# Patient Record
Sex: Female | Born: 1970 | Race: White | Hispanic: No | Smoking: Never smoker
Health system: Southern US, Community
[De-identification: ages and names within clinical notes are randomized; demographics above are authoritative.]

## PROBLEM LIST (undated history)

## (undated) DIAGNOSIS — E039 Hypothyroidism, unspecified: Secondary | ICD-10-CM

## (undated) DIAGNOSIS — F329 Major depressive disorder, single episode, unspecified: Secondary | ICD-10-CM

## (undated) DIAGNOSIS — F32A Depression, unspecified: Secondary | ICD-10-CM

## (undated) DIAGNOSIS — G43909 Migraine, unspecified, not intractable, without status migrainosus: Secondary | ICD-10-CM

## (undated) DIAGNOSIS — D649 Anemia, unspecified: Secondary | ICD-10-CM

## (undated) HISTORY — PX: TUBAL LIGATION: SHX77

## (undated) HISTORY — PX: CHOLECYSTECTOMY: SHX55

## (undated) HISTORY — PX: OTHER SURGICAL HISTORY: SHX169

## (undated) HISTORY — DX: Migraine, unspecified, not intractable, without status migrainosus: G43.909

## (undated) HISTORY — PX: ABDOMINAL HYSTERECTOMY: SHX81

## (undated) HISTORY — PX: GASTRIC BYPASS: SHX52

---

## 2018-11-03 ENCOUNTER — Encounter (HOSPITAL_COMMUNITY): Admission: EM | Disposition: A | Discharge status: Home Health | Payer: Self-pay | Source: Home / Self Care

## 2018-11-03 ENCOUNTER — Observation Stay (HOSPITAL_COMMUNITY): Payer: Self-pay | Admitting: Certified Registered"

## 2018-11-03 ENCOUNTER — Encounter (HOSPITAL_COMMUNITY): Payer: Self-pay | Admitting: Emergency Medicine

## 2018-11-03 ENCOUNTER — Inpatient Hospital Stay (HOSPITAL_COMMUNITY)
Admission: EM | Admit: 2018-11-03 | Discharge: 2018-11-07 | DRG: 578 | Disposition: A | Discharge status: Home Health | Payer: Self-pay | Attending: Surgery | Admitting: Surgery

## 2018-11-03 ENCOUNTER — Other Ambulatory Visit: Payer: Self-pay

## 2018-11-03 ENCOUNTER — Observation Stay (HOSPITAL_COMMUNITY): Payer: Self-pay

## 2018-11-03 DIAGNOSIS — S81812A Laceration without foreign body, left lower leg, initial encounter: Secondary | ICD-10-CM | POA: Diagnosis present

## 2018-11-03 DIAGNOSIS — W540XXA Bitten by dog, initial encounter: Secondary | ICD-10-CM

## 2018-11-03 DIAGNOSIS — Z79899 Other long term (current) drug therapy: Secondary | ICD-10-CM

## 2018-11-03 DIAGNOSIS — E890 Postprocedural hypothyroidism: Secondary | ICD-10-CM | POA: Diagnosis present

## 2018-11-03 DIAGNOSIS — F329 Major depressive disorder, single episode, unspecified: Secondary | ICD-10-CM | POA: Diagnosis present

## 2018-11-03 DIAGNOSIS — Z9884 Bariatric surgery status: Secondary | ICD-10-CM

## 2018-11-03 DIAGNOSIS — T148XXA Other injury of unspecified body region, initial encounter: Secondary | ICD-10-CM

## 2018-11-03 DIAGNOSIS — S81852A Open bite, left lower leg, initial encounter: Principal | ICD-10-CM | POA: Diagnosis present

## 2018-11-03 DIAGNOSIS — M25572 Pain in left ankle and joints of left foot: Secondary | ICD-10-CM

## 2018-11-03 HISTORY — DX: Anemia, unspecified: D64.9

## 2018-11-03 HISTORY — PX: I & D EXTREMITY: SHX5045

## 2018-11-03 LAB — TYPE AND SCREEN
ABO/RH(D): O POS
Antibody Screen: NEGATIVE

## 2018-11-03 LAB — COMPREHENSIVE METABOLIC PANEL
ALT: 44 U/L (ref 0–44)
ANION GAP: 12 (ref 5–15)
AST: 54 U/L — ABNORMAL HIGH (ref 15–41)
Albumin: 3.6 g/dL (ref 3.5–5.0)
Alkaline Phosphatase: 80 U/L (ref 38–126)
BUN: 10 mg/dL (ref 6–20)
CO2: 25 mmol/L (ref 22–32)
Calcium: 7.4 mg/dL — ABNORMAL LOW (ref 8.9–10.3)
Chloride: 103 mmol/L (ref 98–111)
Creatinine, Ser: 0.58 mg/dL (ref 0.44–1.00)
GFR calc Af Amer: >60 mL/min (ref >60)
GFR calc non Af Amer: >60 mL/min (ref >60)
Glucose, Bld: 109 mg/dL — ABNORMAL HIGH (ref 70–99)
Potassium: 3.9 mmol/L (ref 3.5–5.1)
Sodium: 140 mmol/L (ref 135–145)
Total Bilirubin: 0.9 mg/dL (ref 0.3–1.2)
Total Protein: 7.1 g/dL (ref 6.5–8.1)

## 2018-11-03 LAB — CBC WITH DIFFERENTIAL/PLATELET
Abs Immature Granulocytes: 0.02 10*3/uL (ref 0.00–0.07)
Basophils Absolute: 0 10*3/uL (ref 0.0–0.1)
Basophils Relative: 0 %
Eosinophils Absolute: 0.1 10*3/uL (ref 0.0–0.5)
Eosinophils Relative: 1 %
HCT: 44.4 % (ref 36.0–46.0)
Hemoglobin: 14.6 g/dL (ref 12.0–15.0)
Immature Granulocytes: 0 %
Lymphocytes Relative: 38 %
Lymphs Abs: 3.2 10*3/uL (ref 0.7–4.0)
MCH: 32.9 pg (ref 26.0–34.0)
MCHC: 32.9 g/dL (ref 30.0–36.0)
MCV: 100 fL (ref 80.0–100.0)
Monocytes Absolute: 0.5 10*3/uL (ref 0.1–1.0)
Monocytes Relative: 5 %
NEUTROS PCT: 56 %
Neutro Abs: 4.8 10*3/uL (ref 1.7–7.7)
Platelets: 176 10*3/uL (ref 150–400)
RBC: 4.44 MIL/uL (ref 3.87–5.11)
RDW: 12.8 % (ref 11.5–15.5)
WBC: 8.6 10*3/uL (ref 4.0–10.5)
nRBC: 0 % (ref 0.0–0.2)

## 2018-11-03 LAB — ABO/RH: ABO/RH(D): O POS

## 2018-11-03 LAB — I-STAT BETA HCG BLOOD, ED (MC, WL, AP ONLY): I-stat hCG, quantitative: 12.5 m[IU]/mL — ABNORMAL HIGH (ref <5)

## 2018-11-03 SURGERY — IRRIGATION AND DEBRIDEMENT EXTREMITY
Anesthesia: General | Laterality: Left

## 2018-11-03 MED ORDER — SODIUM CHLORIDE 0.9 % IV SOLN
3.0000 g | Freq: Four times a day (QID) | INTRAVENOUS | Status: AC
  Administered 2018-11-04 – 2018-11-06 (×12): 3 g via INTRAVENOUS
  Filled 2018-11-03 (×12): qty 3

## 2018-11-03 MED ORDER — OXYCODONE HCL 5 MG PO TABS: 5.0000 mg | ORAL_TABLET | ORAL | Status: DC | PRN

## 2018-11-03 MED ORDER — HYDROMORPHONE HCL 1 MG/ML IJ SOLN
0.2500 mg | INTRAMUSCULAR | Status: DC | PRN
  Administered 2018-11-03 (×3): 0.5 mg via INTRAVENOUS

## 2018-11-03 MED ORDER — DEXAMETHASONE SODIUM PHOSPHATE 10 MG/ML IJ SOLN
INTRAMUSCULAR | Status: AC
  Filled 2018-11-03: qty 1

## 2018-11-03 MED ORDER — MIDAZOLAM HCL 2 MG/2ML IJ SOLN
INTRAMUSCULAR | Status: AC
  Filled 2018-11-03: qty 2

## 2018-11-03 MED ORDER — ONDANSETRON HCL 4 MG/2ML IJ SOLN
INTRAMUSCULAR | Status: AC
  Filled 2018-11-03: qty 2

## 2018-11-03 MED ORDER — PANTOPRAZOLE SODIUM 40 MG PO TBEC
40.0000 mg | DELAYED_RELEASE_TABLET | Freq: Every day | ORAL | Status: DC
  Administered 2018-11-04 – 2018-11-07 (×4): 40 mg via ORAL
  Filled 2018-11-03 (×4): qty 1

## 2018-11-03 MED ORDER — FENTANYL CITRATE (PF) 250 MCG/5ML IJ SOLN
INTRAMUSCULAR | Status: DC | PRN
  Administered 2018-11-03: 100 ug via INTRAVENOUS

## 2018-11-03 MED ORDER — EPHEDRINE 5 MG/ML INJ
INTRAVENOUS | Status: AC
  Filled 2018-11-03: qty 10

## 2018-11-03 MED ORDER — RABIES IMMUNE GLOBULIN 150 UNIT/ML IM INJ
20.0000 [IU]/kg | INJECTION | Freq: Once | INTRAMUSCULAR | Status: DC
  Filled 2018-11-03: qty 13

## 2018-11-03 MED ORDER — ONDANSETRON HCL 4 MG/2ML IJ SOLN: 4.0000 mg | Freq: Once | INTRAMUSCULAR | Status: DC | PRN

## 2018-11-03 MED ORDER — OXYCODONE HCL 5 MG PO TABS
10.0000 mg | ORAL_TABLET | ORAL | Status: DC | PRN
  Administered 2018-11-04 (×5): 10 mg via ORAL
  Filled 2018-11-03 (×5): qty 2

## 2018-11-03 MED ORDER — SODIUM CHLORIDE 0.9 % IV SOLN
INTRAVENOUS | Status: DC
  Administered 2018-11-03 – 2018-11-06 (×5): via INTRAVENOUS

## 2018-11-03 MED ORDER — SUCCINYLCHOLINE 20MG/ML (10ML) SYRINGE FOR MEDFUSION PUMP - OPTIME
INTRAMUSCULAR | Status: DC | PRN
  Administered 2018-11-03: 140 mg via INTRAVENOUS

## 2018-11-03 MED ORDER — SUCCINYLCHOLINE CHLORIDE 200 MG/10ML IV SOSY
PREFILLED_SYRINGE | INTRAVENOUS | Status: AC
  Filled 2018-11-03: qty 10

## 2018-11-03 MED ORDER — PROPOFOL 10 MG/ML IV BOLUS
INTRAVENOUS | Status: AC
  Filled 2018-11-03: qty 20

## 2018-11-03 MED ORDER — PROPOFOL 10 MG/ML IV BOLUS
INTRAVENOUS | Status: DC | PRN
  Administered 2018-11-03: 100 mg via INTRAVENOUS

## 2018-11-03 MED ORDER — HYDRALAZINE HCL 20 MG/ML IJ SOLN: 10.0000 mg | INTRAMUSCULAR | Status: DC | PRN

## 2018-11-03 MED ORDER — EPHEDRINE SULFATE 50 MG/ML IJ SOLN
INTRAMUSCULAR | Status: DC | PRN
  Administered 2018-11-03 (×4): 10 mg via INTRAVENOUS

## 2018-11-03 MED ORDER — SODIUM CHLORIDE 0.9 % IV SOLN
3.0000 g | Freq: Once | INTRAVENOUS | Status: AC
  Administered 2018-11-03: 3 g via INTRAVENOUS
  Filled 2018-11-03: qty 3

## 2018-11-03 MED ORDER — PANTOPRAZOLE SODIUM 40 MG IV SOLR
40.0000 mg | Freq: Every day | INTRAVENOUS | Status: DC
  Filled 2018-11-03: qty 40

## 2018-11-03 MED ORDER — DEXAMETHASONE SODIUM PHOSPHATE 10 MG/ML IJ SOLN
INTRAMUSCULAR | Status: DC | PRN
  Administered 2018-11-03: 10 mg via INTRAVENOUS

## 2018-11-03 MED ORDER — LIDOCAINE HCL (CARDIAC) PF 100 MG/5ML IV SOSY
PREFILLED_SYRINGE | INTRAVENOUS | Status: DC | PRN
  Administered 2018-11-03: 100 mg via INTRATRACHEAL

## 2018-11-03 MED ORDER — LIDOCAINE 2% (20 MG/ML) 5 ML SYRINGE
INTRAMUSCULAR | Status: AC
  Filled 2018-11-03: qty 5

## 2018-11-03 MED ORDER — MIDAZOLAM HCL 2 MG/2ML IJ SOLN
INTRAMUSCULAR | Status: DC | PRN
  Administered 2018-11-03: 2 mg via INTRAVENOUS

## 2018-11-03 MED ORDER — 0.9 % SODIUM CHLORIDE (POUR BTL) OPTIME
TOPICAL | Status: DC | PRN
  Administered 2018-11-03: 1000 mL

## 2018-11-03 MED ORDER — SODIUM CHLORIDE 0.9 % IR SOLN
Status: DC | PRN
  Administered 2018-11-03: 3000 mL

## 2018-11-03 MED ORDER — HYDROMORPHONE HCL 1 MG/ML IJ SOLN
INTRAMUSCULAR | Status: AC
  Administered 2018-11-03: 0.5 mg via INTRAVENOUS
  Filled 2018-11-03: qty 1

## 2018-11-03 MED ORDER — ONDANSETRON HCL 4 MG/2ML IJ SOLN
4.0000 mg | Freq: Four times a day (QID) | INTRAMUSCULAR | Status: DC | PRN
  Administered 2018-11-04 – 2018-11-07 (×7): 4 mg via INTRAVENOUS
  Filled 2018-11-03 (×8): qty 2

## 2018-11-03 MED ORDER — ACETAMINOPHEN 325 MG PO TABS
650.0000 mg | ORAL_TABLET | ORAL | Status: DC | PRN
  Administered 2018-11-04: 650 mg via ORAL
  Filled 2018-11-03: qty 2

## 2018-11-03 MED ORDER — FENTANYL CITRATE (PF) 250 MCG/5ML IJ SOLN
INTRAMUSCULAR | Status: AC
  Filled 2018-11-03: qty 5

## 2018-11-03 MED ORDER — LACTATED RINGERS IV SOLN
INTRAVENOUS | Status: DC | PRN
  Administered 2018-11-03 (×2): via INTRAVENOUS

## 2018-11-03 MED ORDER — RABIES VACCINE, PCEC IM SUSR
1.0000 mL | Freq: Once | INTRAMUSCULAR | Status: DC
  Filled 2018-11-03: qty 1

## 2018-11-03 MED ORDER — HEPARIN SODIUM (PORCINE) 5000 UNIT/ML IJ SOLN
5000.0000 [IU] | Freq: Three times a day (TID) | INTRAMUSCULAR | Status: DC
  Administered 2018-11-03 – 2018-11-07 (×11): 5000 [IU] via SUBCUTANEOUS
  Filled 2018-11-03 (×11): qty 1

## 2018-11-03 MED ORDER — ONDANSETRON 4 MG PO TBDP: 4.0000 mg | ORAL_TABLET | Freq: Four times a day (QID) | ORAL | Status: DC | PRN

## 2018-11-03 MED ORDER — MEPERIDINE HCL 50 MG/ML IJ SOLN: 6.2500 mg | INTRAMUSCULAR | Status: DC | PRN

## 2018-11-03 MED ORDER — PROMETHAZINE HCL 25 MG/ML IJ SOLN
INTRAMUSCULAR | Status: AC
  Administered 2018-11-03: 12.5 mg
  Filled 2018-11-03: qty 1

## 2018-11-03 MED ORDER — HYDROMORPHONE HCL 1 MG/ML IJ SOLN
0.5000 mg | INTRAMUSCULAR | Status: DC | PRN
  Administered 2018-11-04 – 2018-11-06 (×12): 0.5 mg via INTRAVENOUS
  Filled 2018-11-03 (×12): qty 1

## 2018-11-03 MED ORDER — METOPROLOL TARTRATE 5 MG/5ML IV SOLN: 5.0000 mg | Freq: Four times a day (QID) | INTRAVENOUS | Status: DC | PRN

## 2018-11-03 MED ORDER — DOCUSATE SODIUM 100 MG PO CAPS
100.0000 mg | ORAL_CAPSULE | Freq: Two times a day (BID) | ORAL | Status: DC
  Administered 2018-11-03 – 2018-11-07 (×8): 100 mg via ORAL
  Filled 2018-11-03 (×8): qty 1

## 2018-11-03 MED ORDER — ONDANSETRON HCL 4 MG/2ML IJ SOLN
INTRAMUSCULAR | Status: DC | PRN
  Administered 2018-11-03: 4 mg via INTRAVENOUS

## 2018-11-03 SURGICAL SUPPLY — 35 items
BANDAGE ACE 4X5 VEL STRL LF (GAUZE/BANDAGES/DRESSINGS) ×3 IMPLANT
BLADE CLIPPER SURG (BLADE) IMPLANT
BNDG GAUZE ELAST 4 BULKY (GAUZE/BANDAGES/DRESSINGS) ×3 IMPLANT
CANISTER SUCT 3000ML PPV (MISCELLANEOUS) ×3 IMPLANT
COVER SURGICAL LIGHT HANDLE (MISCELLANEOUS) ×3 IMPLANT
COVER WAND RF STERILE (DRAPES) ×3 IMPLANT
DRAPE LAPAROSCOPIC ABDOMINAL (DRAPES) IMPLANT
DRAPE LAPAROTOMY 100X72 PEDS (DRAPES) IMPLANT
DRSG PAD ABDOMINAL 8X10 ST (GAUZE/BANDAGES/DRESSINGS) ×3 IMPLANT
ELECT CAUTERY BLADE 6.4 (BLADE) ×3 IMPLANT
ELECT REM PT RETURN 9FT ADLT (ELECTROSURGICAL) ×3
ELECTRODE REM PT RTRN 9FT ADLT (ELECTROSURGICAL) ×1 IMPLANT
GAUZE SPONGE 4X4 12PLY STRL (GAUZE/BANDAGES/DRESSINGS) ×3 IMPLANT
GLOVE BIO SURGEON STRL SZ 6 (GLOVE) ×6 IMPLANT
GLOVE BIO SURGEON STRL SZ 6.5 (GLOVE) ×2 IMPLANT
GLOVE BIO SURGEONS STRL SZ 6.5 (GLOVE) ×1
GLOVE INDICATOR 6.5 STRL GRN (GLOVE) ×6 IMPLANT
GOWN STRL REUS W/ TWL LRG LVL3 (GOWN DISPOSABLE) ×2 IMPLANT
GOWN STRL REUS W/TWL LRG LVL3 (GOWN DISPOSABLE) ×4
HANDPIECE INTERPULSE COAX TIP (DISPOSABLE) ×2
KIT BASIN OR (CUSTOM PROCEDURE TRAY) ×3 IMPLANT
KIT TURNOVER KIT B (KITS) ×3 IMPLANT
NS IRRIG 1000ML POUR BTL (IV SOLUTION) ×3 IMPLANT
PACK SURGICAL SETUP 50X90 (CUSTOM PROCEDURE TRAY) ×3 IMPLANT
PAD ARMBOARD 7.5X6 YLW CONV (MISCELLANEOUS) ×3 IMPLANT
PENCIL BUTTON HOLSTER BLD 10FT (ELECTRODE) ×3 IMPLANT
SET HNDPC FAN SPRY TIP SCT (DISPOSABLE) ×1 IMPLANT
SUT ETHILON 3 0 PS 1 (SUTURE) ×6 IMPLANT
SWAB COLLECTION DEVICE MRSA (MISCELLANEOUS) IMPLANT
SWAB CULTURE ESWAB REG 1ML (MISCELLANEOUS) IMPLANT
TOWEL OR 17X24 6PK STRL BLUE (TOWEL DISPOSABLE) ×3 IMPLANT
TOWEL OR 17X26 10 PK STRL BLUE (TOWEL DISPOSABLE) ×3 IMPLANT
TUBE CONNECTING 12'X1/4 (SUCTIONS) ×1
TUBE CONNECTING 12X1/4 (SUCTIONS) ×2 IMPLANT
YANKAUER SUCT BULB TIP NO VENT (SUCTIONS) ×3 IMPLANT

## 2018-11-03 NOTE — ED Notes (Signed)
Pt to OR at this time.

## 2018-11-03 NOTE — ED Notes (Signed)
Dr Connor at bedside 

## 2018-11-03 NOTE — H&P (Signed)
Surgical H&P  CC: dog bite  HPI: this is a 47 year old woman who sustained a pedicle tacked today when she is trying to keep the dog away from her niece. She sustained complex lacerations to the left lower leg. She was brought in by EMS approximately 5:30 PM today. Complains of burning and stinging pain to the sites.   She has a history of hypothyroid and says she takes 250 g of Synthroid daily. Denies any history of heart disease, lung disease, or diabetes.  Allergies not on file  Past Medical History:  Diagnosis Date  . Anemia     Past Surgical History:  Procedure Laterality Date  . ABDOMINAL HYSTERECTOMY    . CHOLECYSTECTOMY    . GASTRIC BYPASS    . TUBAL LIGATION      No family history on file.  Social History   Socioeconomic History  . Marital status: Not on file    Spouse name: Not on file  . Number of children: Not on file  . Years of education: Not on file  . Highest education level: Not on file  Occupational History  . Not on file  Social Needs  . Financial resource strain: Not on file  . Food insecurity:    Worry: Not on file    Inability: Not on file  . Transportation needs:    Medical: Not on file    Non-medical: Not on file  Tobacco Use  . Smoking status: Never Smoker  . Smokeless tobacco: Never Used  Substance and Sexual Activity  . Alcohol use: Yes    Comment: Wine  . Drug use: Never  . Sexual activity: Not on file  Lifestyle  . Physical activity:    Days per week: Not on file    Minutes per session: Not on file  . Stress: Not on file  Relationships  . Social connections:    Talks on phone: Not on file    Gets together: Not on file    Attends religious service: Not on file    Active member of club or organization: Not on file    Attends meetings of clubs or organizations: Not on file    Relationship status: Not on file  Other Topics Concern  . Not on file  Social History Narrative  . Not on file    No current facility-administered  medications on file prior to encounter.    No current outpatient medications on file prior to encounter.    Review of Systems: a complete, 10pt review of systems was completed with pertinent positives and negatives as documented in the HPI  Physical Exam: Vitals:   11/03/18 1745 11/03/18 1800  BP: 106/69 101/72  Pulse: 72 78  Resp:    Temp:    SpO2: 95% 97%   Gen: A&Ox3, she is tearful and appropriately emotionally distressed Head: normocephalic, atraumatic Eyes: extraocular motions intact, anicteric.  Neck: supple without mass or thyromegaly Chest: unlabored respirations, symmetrical air entry, clear bilaterally   Cardiovascular: RRR with palpable distal pulses, no pedal edema Abdomen: obese, soft, nondistended, nontender. No mass or organomegaly.  Extremities: warm, without edema; there is a approximately 12 x 15 cm complex laceration on the left medial posterior calf with exposed muscle as well as stellate laceration on the left anterior medial lower leg, no active bleeding. Neuro: grossly intact Psych: appropriate mood and affect, normal insight  Skin: warm and dry, C Nguyen description above   No flowsheet data found.  No flowsheet data found.  No results found for: INR, PROTIME  Imaging: No results found.    A/P: 47 year old woman sustained complex avulsion laceration to the left lower extremity following dog bite. The dog is known to be vaccinated against rabies. Apparently the dog as attacked child about a month ago as well. We discussed that the larger laceration is not going to close and so likely will debride the free paddle of skin and place a damp to dry dressing, possible wound VAC and future evaluation by plastic surgery for skin graft coverage. The more anterior laceration medial to close loosely. We discussed high risk of infection in these wounds. Discussed risks of bleeding, infection, pain, scarring, risks of general anesthesia, she understands and agrees  to proceed.   Phylliss Blakeshelsea Connor, MD [facility], [place] Pager [phone]

## 2018-11-03 NOTE — Anesthesia Procedure Notes (Signed)
Procedure Name: Intubation Date/Time: 11/03/2018 7:13 PM Performed by: Claris Che, CRNA Pre-anesthesia Checklist: Patient identified, Emergency Drugs available, Suction available, Patient being monitored and Timeout performed Patient Re-evaluated:Patient Re-evaluated prior to induction Oxygen Delivery Method: Circle system utilized Preoxygenation: Pre-oxygenation with 100% oxygen Induction Type: IV induction, Rapid sequence and Cricoid Pressure applied Laryngoscope Size: Mac and 3 Grade View: Grade I Tube type: Oral Tube size: 7.5 mm Number of attempts: 1 Airway Equipment and Method: Stylet Placement Confirmation: ETT inserted through vocal cords under direct vision,  positive ETCO2 and breath sounds checked- equal and bilateral Secured at: 23 cm Tube secured with: Tape Dental Injury: Teeth and Oropharynx as per pre-operative assessment

## 2018-11-03 NOTE — Anesthesia Preprocedure Evaluation (Signed)
Anesthesia Evaluation  Patient identified by MRN, date of birth, ID band Patient awake    Reviewed: Allergy & Precautions, NPO status , Patient's Chart, lab work & pertinent test results  Airway Mallampati: I  TM Distance: >3 FB Neck ROM: Full    Dental   Pulmonary    Pulmonary exam normal        Cardiovascular Normal cardiovascular exam     Neuro/Psych    GI/Hepatic   Endo/Other    Renal/GU      Musculoskeletal   Abdominal   Peds  Hematology   Anesthesia Other Findings   Reproductive/Obstetrics                             Anesthesia Physical Anesthesia Plan  ASA: III  Anesthesia Plan: General   Post-op Pain Management:    Induction: Intravenous  PONV Risk Score and Plan: 3 and Ondansetron and Midazolam  Airway Management Planned: Oral ETT  Additional Equipment:   Intra-op Plan:   Post-operative Plan: Extubation in OR  Informed Consent: I have reviewed the patients History and Physical, chart, labs and discussed the procedure including the risks, benefits and alternatives for the proposed anesthesia with the patient or authorized representative who has indicated his/her understanding and acceptance.     Plan Discussed with: CRNA and Surgeon  Anesthesia Plan Comments:         Anesthesia Quick Evaluation

## 2018-11-03 NOTE — ED Triage Notes (Signed)
Pt to ED via [facility] after reported being attacked by a pit bull dog.  Pt has large lacerations to left lower leg   Bleeding controlled at this time

## 2018-11-03 NOTE — Op Note (Signed)
Operative Note  Ann MarshallKimberly Randall  [id number]  [id number]  11/03/2018   Surgeon: Leeroy Bockhelsea A ConnorMD  Assistant: none  Procedure performed: debridement of skin, subcutaneous tissue, fascia and muscle 120cm with partial closure of complex degloving avulsion laceration of the left lower extremity  Preop diagnosis: complex wound of left lower extremity following dog bite Post-op diagnosis/intraop findings: avulsion laceration with partial degloving injury,  wound extends to the muscle surface  Specimens: none Retained items: Kerlix packing will be changed at bedside tomorrow EBL: minimalcc Complications: none  Description of procedure: After obtaining informed consent the patient was taken to the operating room and placed supine on operating room table wheregeneral anesthesia was initiated, preoperative antibiotics were administered, SCDs applied, and a formal timeout was performed. The left lower cavity was prepped and draped in the usual sterile fashion. The wounds were inspected, denuded and devitalized skin was excised using the cautery. There is a proximal a 10 x 12 cm avulsion laceration on the posterior medial calf with 2 adjacent small puncture lacerations with small devitalized skin bridges. The skin bridges are excised creating a relatively clean edged circular wound. This wound extends down to the muscle belly of gastrocnemius. There is no active bleeding. The subcutaneous tissue is degloved from the underlying fascia anteromedially connecting via an approximately 3 cm skin bridge this larger open wound to the more stellate laceration on the anteromedial lower leg. Both wounds were ensured hemostatic using the cautery. The wounds were irrigated with the pulse lavage, using 3 L of sterile saline. The anterior laceration was then closed loosely with simpleinterrupted 3-0 nylon's. The larger more posterior wound was packed with saline moistened Kerlix, one tail of the Kerlix gently placed  under the skin bridge to wick the closed wound anteriorly. Dry gauze and AVD pads were then applied and the leg is wrapped with an Ace wrap. The patient was then awakened, extubated and taken to PACU in stable condition.   All counts were correct at the completion of the case.

## 2018-11-03 NOTE — ED Notes (Signed)
Animal control at bedside

## 2018-11-03 NOTE — ED Provider Notes (Signed)
[name]   CSN: [id number] Arrival date & time: 11/03/18  1724     History   Chief Complaint Chief Complaint  Patient presents with  . Animal Bite    HPI Ann Randall is a 47 y.o. female.  The history is provided by the patient and the EMS personnel. No language interpreter was used.  Animal Bite  Contact animal:  Dog Location:  Leg Leg injury location:  L lower leg Incident location:  Another residence Provoked: provoked   Animal's rabies vaccination status:  Up to date Animal in possession: yes   Tetanus status:  Up to date Relieved by:  Nothing Worsened by:  Nothing Ineffective treatments:  None tried Associated symptoms: numbness (mild)   Associated symptoms: no fever, no rash and no swelling     Past Medical History:  Diagnosis Date  . Anemia     There are no active problems to display for this patient.   Past Surgical History:  Procedure Laterality Date  . ABDOMINAL HYSTERECTOMY    . CHOLECYSTECTOMY    . GASTRIC BYPASS    . TUBAL LIGATION       OB History   None      Home Medications    Prior to Admission medications   Not on File    Family History No family history on file.  Social History Social History   Tobacco Use  . Smoking status: Never Smoker  . Smokeless tobacco: Never Used  Substance Use Topics  . Alcohol use: Yes    Comment: Wine  . Drug use: Never     Allergies   Patient has no allergy information on record.   Review of Systems Review of Systems  Constitutional: Negative for chills and fever.  HENT: Negative for ear pain and sore throat.   Eyes: Negative for pain and visual disturbance.  Respiratory: Negative for cough and shortness of breath.   Cardiovascular: Negative for chest pain and palpitations.  Gastrointestinal: Negative for abdominal pain and vomiting.  Genitourinary: Negative for dysuria and hematuria.  Musculoskeletal: Negative for  arthralgias and back pain.  Skin: Positive for wound. Negative for color change and rash.  Neurological: Positive for numbness (mild). Negative for seizures and syncope.  All other systems reviewed and are negative.    Physical Exam Updated Vital Signs BP 101/72   Pulse 78   Temp 98.2 F (36.8 C) (Oral)   Resp 20   Ht 5\' 4"  (1.626 m)   Wt 98.9 kg   LMP  (Exact Date)   SpO2 97%   BMI 37.42 kg/m   Physical Exam  Constitutional: She appears well-developed and well-nourished. No distress.  HENT:  Head: Normocephalic and atraumatic.  Eyes: Conjunctivae are normal.  Neck: Neck supple.  Cardiovascular: Normal rate and regular rhythm.  No murmur heard. Pulmonary/Chest: Effort normal and breath sounds normal. No respiratory distress.  Abdominal: Soft. There is no tenderness.  Musculoskeletal: She exhibits no edema.       Left foot: There is normal capillary refill.  2+ DP/PT palpable on L leg.  Mild numbness on L sole of foot  Large laceration on LLE  Neurological: She is alert.  Skin: Skin is warm and dry.  Psychiatric: She has a normal mood and affect.  Nursing note and vitals reviewed.        ED Treatments / Results  Labs (all labs ordered are listed, but only abnormal results are displayed) Labs Reviewed  CBC WITH DIFFERENTIAL/PLATELET  COMPREHENSIVE METABOLIC PANEL  I-STAT BETA HCG BLOOD, ED ([facility], [facility], [facility] ONLY)  TYPE AND SCREEN    EKG None  Radiology No results found.  Procedures Procedures (including critical care time)  Medications Ordered in ED Medications  Ampicillin-Sulbactam (UNASYN) 3 g in sodium chloride 0.9 % 100 mL IVPB (has no administration in time range)     Initial Impression / Assessment and Plan / ED Course  I have reviewed the triage vital signs and the nursing notes.  Pertinent labs & imaging results that were available during my care of the patient were reviewed by me and considered in my medical decision making (see chart for  details).     Patient is a 47 y.o. female with no sig PMHx presenting for large dog bite on LLE. No neurovascular compromise noted. Dog and patient up to date on vaccinations. Patient given IV unasyn and IV pain medication. Labs unremarkable - Hgb stable at 14.6, platelets normal. XR shows large laceration with no bone involvement.  Trauma surgery was consulted and came to see patient at the bedside. Patient will be taken to the OR for wash out.   Final Clinical Impressions(s) / ED Diagnoses   Final diagnoses:  Animal bite  Dog bite of left lower leg, initial encounter    ED Discharge Orders    None       Joaquin Courts, MD 11/03/18 Herminio Commons    Eber Hong, MD 11/05/18 [phone]

## 2018-11-03 NOTE — Transfer of Care (Signed)
Immediate Anesthesia Transfer of Care Note  Patient: Ann Randall  Procedure(s) Performed: DEBRIDEMENT OF LEFT LOWER EXTREMITY WOUND (Left )  Patient Location: PACU  Anesthesia Type:General  Level of Consciousness: awake, oriented, drowsy and patient cooperative  Airway & Oxygen Therapy: Patient Spontanous Breathing and Patient connected to nasal cannula oxygen  Post-op Assessment: Report given to RN, Post -op Vital signs reviewed and stable and Patient moving all extremities X 4  Post vital signs: Reviewed and stable  Last Vitals:  Vitals Value Taken Time  BP 122/83 11/03/2018  7:58 PM  Temp    Pulse 96 11/03/2018  8:04 PM  Resp 17 11/03/2018  8:04 PM  SpO2 92 % 11/03/2018  8:04 PM  Vitals shown include unvalidated device data.  Last Pain:  Vitals:   11/03/18 1741  TempSrc:   PainSc: 10-Worst pain ever         Complications: No apparent anesthesia complications

## 2018-11-03 NOTE — Anesthesia Postprocedure Evaluation (Signed)
Anesthesia Post Note  Patient: Chanetta MarshallKimberly Rosten  Procedure(s) Performed: DEBRIDEMENT OF LEFT LOWER EXTREMITY WOUND (Left )     Patient location during evaluation: PACU Anesthesia Type: General Level of consciousness: awake and alert Pain management: pain level controlled Vital Signs Assessment: post-procedure vital signs reviewed and stable Respiratory status: spontaneous breathing, nonlabored ventilation, respiratory function stable and patient connected to nasal cannula oxygen Cardiovascular status: blood pressure returned to baseline and stable Postop Assessment: no apparent nausea or vomiting Anesthetic complications: no    Last Vitals:  Vitals:   11/03/18 2043 11/03/18 2045  BP: 116/77   Pulse: 95   Resp: 18   Temp:  (P) 36.6 C  SpO2: 97%     Last Pain:  Vitals:   11/03/18 1741  TempSrc:   PainSc: 10-Worst pain ever                 Rexanna Louthan DAVID

## 2018-11-03 NOTE — ED Provider Notes (Signed)
I saw and evaluated the patient, reviewed the resident's note and I agree with the findings and plan.  Pertinent History: Patient is a unfortunate 47 year old female who is very pleasant but unfortunately was bitten on her left lower extremity by a dog, this is the neighbors dog, on a chain, unsure about vaccination status, reports that this occurred just prior to arrival.  Pain is severe persistent and worse with palpation, bleeding controlled  Pertinent Exam findings: On exam the patient has several large wounds which are open to the left lower extremity medially and anteriorly below the knee.  Pulses intact of the foot, pictures attached  The case was discussed with the trauma surgeon, she will see the patient in consultation for potential operating room cleanout.  I was personally present and directly supervised the following procedures:  Trauma Rescucitation  We will consult with trauma surgery, make sure tetanus is updated, adequate pain control, Abx.        I personally interpreted the EKG as well as the resident and agree with the interpretation on the resident's chart.  Final diagnoses:  Animal bite  Dog bite of left lower leg, initial encounter      Eber HongMiller, Lamanda Rudder, MD 11/05/18 [phone]

## 2018-11-04 ENCOUNTER — Other Ambulatory Visit: Payer: Self-pay

## 2018-11-04 ENCOUNTER — Encounter (HOSPITAL_COMMUNITY): Payer: Self-pay | Admitting: Surgery

## 2018-11-04 DIAGNOSIS — S81852A Open bite, left lower leg, initial encounter: Secondary | ICD-10-CM

## 2018-11-04 DIAGNOSIS — W540XXA Bitten by dog, initial encounter: Secondary | ICD-10-CM

## 2018-11-04 LAB — COMPREHENSIVE METABOLIC PANEL
ALT: 38 U/L (ref 0–44)
AST: 41 U/L (ref 15–41)
Albumin: 3.4 g/dL — ABNORMAL LOW (ref 3.5–5.0)
Alkaline Phosphatase: 74 U/L (ref 38–126)
Anion gap: 9 (ref 5–15)
BUN: 11 mg/dL (ref 6–20)
CHLORIDE: 107 mmol/L (ref 98–111)
CO2: 27 mmol/L (ref 22–32)
CREATININE: 0.71 mg/dL (ref 0.44–1.00)
Calcium: 7.4 mg/dL — ABNORMAL LOW (ref 8.9–10.3)
GFR calc Af Amer: >60 mL/min (ref >60)
GFR calc non Af Amer: >60 mL/min (ref >60)
Glucose, Bld: 173 mg/dL — ABNORMAL HIGH (ref 70–99)
Potassium: 4.4 mmol/L (ref 3.5–5.1)
Sodium: 143 mmol/L (ref 135–145)
Total Bilirubin: 0.6 mg/dL (ref 0.3–1.2)
Total Protein: 6.7 g/dL (ref 6.5–8.1)

## 2018-11-04 LAB — CBC
HCT: 45.1 % (ref 36.0–46.0)
Hemoglobin: 14.1 g/dL (ref 12.0–15.0)
MCH: 32.1 pg (ref 26.0–34.0)
MCHC: 31.3 g/dL (ref 30.0–36.0)
MCV: 102.7 fL — ABNORMAL HIGH (ref 80.0–100.0)
Platelets: 189 10*3/uL (ref 150–400)
RBC: 4.39 MIL/uL (ref 3.87–5.11)
RDW: 12.7 % (ref 11.5–15.5)
WBC: 6.9 10*3/uL (ref 4.0–10.5)
nRBC: 0 % (ref 0.0–0.2)

## 2018-11-04 LAB — HIV ANTIBODY (ROUTINE TESTING W REFLEX): HIV Screen 4th Generation wRfx: NONREACTIVE

## 2018-11-04 MED ORDER — MAGNESIUM OXIDE 400 (241.3 MG) MG PO TABS
400.0000 mg | ORAL_TABLET | Freq: Every day | ORAL | Status: DC
  Administered 2018-11-04 – 2018-11-07 (×4): 400 mg via ORAL
  Filled 2018-11-04 (×4): qty 1

## 2018-11-04 MED ORDER — ACETAMINOPHEN 500 MG PO TABS: 1000.0000 mg | ORAL_TABLET | Freq: Three times a day (TID) | ORAL | Status: DC

## 2018-11-04 MED ORDER — SERTRALINE HCL 100 MG PO TABS
100.0000 mg | ORAL_TABLET | Freq: Every day | ORAL | Status: DC
  Administered 2018-11-04 – 2018-11-07 (×4): 100 mg via ORAL
  Filled 2018-11-04 (×4): qty 1

## 2018-11-04 MED ORDER — LEVOTHYROXINE SODIUM 75 MCG PO TABS
375.0000 ug | ORAL_TABLET | Freq: Every day | ORAL | Status: DC
  Administered 2018-11-04: 375 ug via ORAL
  Filled 2018-11-04: qty 3

## 2018-11-04 MED ORDER — METHOCARBAMOL 750 MG PO TABS
750.0000 mg | ORAL_TABLET | Freq: Three times a day (TID) | ORAL | Status: DC
  Administered 2018-11-04 – 2018-11-06 (×8): 750 mg via ORAL
  Filled 2018-11-04 (×8): qty 1

## 2018-11-04 MED ORDER — ACETAMINOPHEN 500 MG PO TABS
1000.0000 mg | ORAL_TABLET | Freq: Three times a day (TID) | ORAL | Status: DC
  Administered 2018-11-04 – 2018-11-07 (×10): 1000 mg via ORAL
  Filled 2018-11-04 (×10): qty 2

## 2018-11-04 MED ORDER — CHLORHEXIDINE GLUCONATE CLOTH 2 % EX PADS
6.0000 | MEDICATED_PAD | Freq: Once | CUTANEOUS | Status: AC
  Administered 2018-11-04: 6 via TOPICAL

## 2018-11-04 MED ORDER — CEFAZOLIN SODIUM-DEXTROSE 2-4 GM/100ML-% IV SOLN
2.0000 g | INTRAVENOUS | Status: DC
  Filled 2018-11-04 (×2): qty 100

## 2018-11-04 MED ORDER — CALCIUM CARBONATE-VITAMIN D 500-200 MG-UNIT PO TABS
4.0000 | ORAL_TABLET | Freq: Two times a day (BID) | ORAL | Status: DC
  Administered 2018-11-04 – 2018-11-07 (×6): 4 via ORAL
  Filled 2018-11-04 (×6): qty 4

## 2018-11-04 MED ORDER — ADULT MULTIVITAMIN W/MINERALS CH
1.0000 | ORAL_TABLET | Freq: Every day | ORAL | Status: DC
  Administered 2018-11-04 – 2018-11-05 (×2): 1 via ORAL
  Filled 2018-11-04 (×2): qty 1

## 2018-11-04 MED ORDER — GABAPENTIN 300 MG PO CAPS
300.0000 mg | ORAL_CAPSULE | Freq: Three times a day (TID) | ORAL | Status: DC
  Administered 2018-11-04 – 2018-11-07 (×10): 300 mg via ORAL
  Filled 2018-11-04 (×10): qty 1

## 2018-11-04 MED ORDER — METHOCARBAMOL 750 MG PO TABS: 750.0000 mg | ORAL_TABLET | Freq: Three times a day (TID) | ORAL | Status: DC

## 2018-11-04 MED ORDER — BOOST / RESOURCE BREEZE PO LIQD CUSTOM
1.0000 | Freq: Two times a day (BID) | ORAL | Status: DC
  Administered 2018-11-04 – 2018-11-07 (×4): 1 via ORAL

## 2018-11-04 MED ORDER — VITAMIN C 500 MG PO TABS
500.0000 mg | ORAL_TABLET | Freq: Two times a day (BID) | ORAL | Status: DC
  Administered 2018-11-04 – 2018-11-05 (×3): 500 mg via ORAL
  Filled 2018-11-04 (×3): qty 1

## 2018-11-04 MED ORDER — CHLORHEXIDINE GLUCONATE CLOTH 2 % EX PADS
6.0000 | MEDICATED_PAD | Freq: Once | CUTANEOUS | Status: AC
  Administered 2018-11-05: 6 via TOPICAL

## 2018-11-04 MED ORDER — ZINC SULFATE 220 (50 ZN) MG PO CAPS
220.0000 mg | ORAL_CAPSULE | Freq: Every day | ORAL | Status: DC
  Administered 2018-11-04 – 2018-11-05 (×2): 220 mg via ORAL
  Filled 2018-11-04 (×2): qty 1

## 2018-11-04 NOTE — Progress Notes (Signed)
Pt arrived from PACU A&Ox4; able to explain situation. No distress noted. No c/o pain.

## 2018-11-04 NOTE — Progress Notes (Signed)
[facility] Progress Note  1 Day Post-Op  Subjective: CC-  Patient states that she continues to have a lot of pain in the LLE. Denies n/t. She has ambulated to the bathroom without issues.  Denies any abdominal pain, nausea, vomiting. Tolerating clear liquids.  [place], currently living with her sister. Not currently working.  Objective: Vital signs in last 24 hours: Temp:  [97.2 F (36.2 C)-98.2 F (36.8 C)] 98.1 F (36.7 C) (12/09 0628) Pulse Rate:  [66-101] 99 (12/09 0628) Resp:  [15-22] 19 (12/09 0628) BP: (95-122)/(67-83) 108/70 (12/09 0628) SpO2:  [94 %-99 %] 99 % (12/09 0628) Weight:  [98.9 kg] 98.9 kg (12/08 1700) Last BM Date: 11/03/18  Intake/Output from previous day: 12/08 0701 - 12/09 0700 In: 2167 [P.O.:270; I.V.:1645.6; IV Piggyback:51.4] Out: 2 [Blood:2] Intake/Output this shift: No intake/output data recorded.  PE: Gen:  Alert, NAD, pleasant HEENT: EOM's intact, pupils equal and round Card:  RRR, 2+ DP pulses bilaterally Pulm:  CTAB, no W/R/R, effort normal Abd: Soft, NT/ND, +BS, no HSM Psych: A&Ox3  Skin: warm and dry LLE: some dried blood noted to ABDs on LLE, ACE wrap clean. Foot WWP, no gross sensory or motor deficits  Lab Results:  Recent Labs    11/03/18 1755 11/04/18 0212  WBC 8.6 6.9  HGB 14.6 14.1  HCT 44.4 45.1  PLT 176 189   BMET Recent Labs    11/03/18 1755 11/04/18 0212  NA 140 143  K 3.9 4.4  CL 103 107  CO2 25 27  GLUCOSE 109* 173*  BUN 10 11  CREATININE 0.58 0.71  CALCIUM 7.4* 7.4*   PT/INR No results for input(s): LABPROT, INR in the last 72 hours. CMP     Component Value Date/Time   NA 143 11/04/2018 0212   K 4.4 11/04/2018 0212   CL 107 11/04/2018 0212   CO2 27 11/04/2018 0212   GLUCOSE 173 (H) 11/04/2018 0212   BUN 11 11/04/2018 0212   CREATININE 0.71 11/04/2018 0212   CALCIUM 7.4 (L) 11/04/2018 0212   PROT 6.7 11/04/2018 0212   ALBUMIN 3.4  (L) 11/04/2018 0212   AST 41 11/04/2018 0212   ALT 38 11/04/2018 0212   ALKPHOS 74 11/04/2018 0212   BILITOT 0.6 11/04/2018 0212   GFRNONAA >60 11/04/2018 0212   GFRAA >60 11/04/2018 0212   Lipase  No results found for: LIPASE     Studies/Results: Dg Tibia/fibula Left  Result Date: 11/03/2018 CLINICAL DATA:  Dog bite, laceration EXAM: LEFT TIBIA AND FIBULA - 2 VIEW COMPARISON:  None. FINDINGS: No fracture or dislocation is seen. The joint spaces are preserved. Large soft tissue laceration along the medial aspect of the mid tibia/calf. No radiopaque foreign body is seen. IMPRESSION: Large soft tissue laceration along the medial aspect of the mid tibia/calf. No fracture, dislocation, or radiopaque foreign body is seen. Electronically Signed   By: Charline BillsSriyesh  Krishnan M.D.   On: 11/03/2018 19:06    Anti-infectives: Anti-infectives (From admission, onward)   Start     Dose/Rate Route Frequency Ordered Stop   11/04/18 0230  Ampicillin-Sulbactam (UNASYN) 3 g in sodium chloride 0.9 % 100 mL IVPB    Note to Pharmacy:  Adjust dose as indicated, coverage for dog bite   3 g 200 mL/hr over 30 Minutes Intravenous Every 6 hours 11/03/18 1816     11/03/18 1730  Ampicillin-Sulbactam (UNASYN) 3 g in sodium chloride 0.9 % 100 mL IVPB  3 g 200 mL/hr over 30 Minutes Intravenous  Once 11/03/18 1729 11/03/18 1901       Assessment/Plan Dog bite Complex wound of left lower extremity - s/p debridement with partial closure 12/8 Dr. Fredricka Bonine. Dr. Ulice Bold to see, may take back to the OR Hypothyroidism - home synthroid Depression - home zoloft H/o gastric bypass Elevated Hcg - per patient this is chronic  ID - unasyn 12/8>> FEN - IVF, reg diet VTE - SCDs, sq heparin Foley - none  Plan: Add scheduled tylenol and robaxin for better pain control. PT/OT. Spoke with Dr. Ulice Bold, likely will take patient back to the OR tomorrow. Labs in AM.   LOS: 0 days    Franne Forts , [facility] 11/04/2018, 8:21 AM Pager: [phone] Mon 7:00 am -11:30 AM Tues-Fri 7:00 am-4:30 pm Sat-Sun 7:00 am-11:30 am

## 2018-11-04 NOTE — Evaluation (Signed)
Occupational Therapy Evaluation Patient Details Name: Ann Randall MRN: [id number] DOB: Jan 12, 1971 Today's Date: 11/04/2018    History of Present Illness Pt presents with dog bite to L lower leg. PMH: anemia, gastric bypass, cholecystectomy   Clinical Impression   This 47 y/o female presents with the above. Limited eval this session due to increased LLE pain levels. Provided education regarding LLE elevation and edema management and initiated education regarding compensatory strategies for performing LB ADLs. Pt currently requiring setup assist for seated UB ADL, mod-maxA for LB ADL; unable to tolerate OOB activity this session due to pain but noted able to perform functional mobility short distance with minA and RW with PT earlier today. Pt reports she will have 24hr supervision/assist available from family at time of discharge home. She will benefit from continued OT services while in acute setting to maximize her safety and independence with ADLs and mobility. Will follow.     Follow Up Recommendations  No OT follow up;Supervision/Assistance - 24 hour    Equipment Recommendations  None recommended by OT           Precautions / Restrictions Precautions Precautions: None Restrictions Weight Bearing Restrictions: No      Mobility Bed Mobility               General bed mobility comments: pt with increased pain with any LLE movement this session; pt able to raise LLE for repositioning/elevation and use of UEs to boost self towards [facility]  Transfers Overall transfer level: Needs assistance Equipment used: Rolling walker (2 wheeled) Transfers: Sit to/from Stand Sit to Stand: Min guard         General transfer comment: pt stood with great effort and increased time with vc's for hand placement on chair, close guarding needed for safety but no physical assist given, vc's for fwd wt shift    Balance Overall balance assessment: Mild deficits observed, not formally tested                                          ADL either performed or assessed with clinical judgement   ADL Overall ADL's : Needs assistance/impaired Eating/Feeding: Modified independent;Sitting   Grooming: Set up;Sitting   Upper Body Bathing: Min guard;Sitting   Lower Body Bathing: Moderate assistance;Sitting/lateral leans   Upper Body Dressing : Set up;Sitting   Lower Body Dressing: Moderate assistance;Maximal assistance;Sitting/lateral leans                 General ADL Comments: initiated education regarding compensatory strategies for LB ADL; pt limited this session due to increased pain; further education on LLE elevation and edema management     Vision         Perception     Praxis      Pertinent Vitals/Pain Pain Assessment: Faces Faces Pain Scale: Hurts whole lot Pain Location: LLE Pain Descriptors / Indicators: Aching;Burning Pain Intervention(s): Limited activity within patient's tolerance;Monitored during session;Repositioned     Hand Dominance     Extremity/Trunk Assessment Upper Extremity Assessment Upper Extremity Assessment: Overall WFL for tasks assessed   Lower Extremity Assessment Lower Extremity Assessment: Defer to PT evaluation RLE Deficits / Details: pt has some soreness posterior R calf as well as R hip, she fell on this side when the dog bit her RLE Sensation: WNL RLE Coordination: WNL LLE Deficits / Details: hip flex 3/5, knee ext 3/5, knee  flex 3/5, all mvmt painful. Swelling noted lateral knee and bruising central patellar region LLE: Unable to fully assess due to pain LLE Sensation: WNL LLE Coordination: WNL   Cervical / Trunk Assessment Cervical / Trunk Assessment: Normal   Communication Communication Communication: No difficulties   Cognition Arousal/Alertness: Awake/alert Behavior During Therapy: WFL for tasks assessed/performed Overall Cognitive Status: Within Functional Limits for tasks assessed                                      General Comments       Exercises Exercises: General Lower Extremity General Exercises - Lower Extremity Ankle Circles/Pumps: AROM;Both;10 reps;Supine Long Arc Quad: AROM;Both;5 reps;Seated Heel Slides: AROM;5 reps;Seated;Left Hip ABduction/ADduction: AROM;Left;5 reps;Seated Straight Leg Raises: AROM;Left;5 reps;Seated   Shoulder Instructions      Home Living Family/patient expects to be discharged to:: Private residence Living Arrangements: Other relatives Available Help at Discharge: Family;Available PRN/intermittently Type of Home: House Home Access: Stairs to enter Entergy CorporationEntrance Stairs-Number of Steps: 3 Entrance Stairs-Rails: None Home Layout: One level         FirefighterBathroom Toilet: Standard     Home Equipment: None   Additional Comments: pt lives with her sister, [place]      Prior Functioning/Environment Level of Independence: Independent                 OT Problem List: Decreased strength;Decreased activity tolerance;Decreased range of motion;Impaired balance (sitting and/or standing);Decreased knowledge of precautions;Pain      OT Treatment/Interventions: Self-care/ADL training;Therapeutic exercise;Therapeutic activities;Patient/family education;Balance training    OT Goals(Current goals can be found in the care plan section) Acute Rehab OT Goals Patient Stated Goal: return home, decrease pain OT Goal Formulation: With patient Time For Goal Achievement: 11/18/18 Potential to Achieve Goals: Good  OT Frequency: Min 2X/week   Barriers to D/C:            Co-evaluation              AM-PAC OT "6 Clicks" Daily Activity     Outcome Measure Help from another person eating meals?: None Help from another person taking care of personal grooming?: A Little Help from another person toileting, which includes using toliet, bedpan, or urinal?: A Little Help from another person bathing (including  washing, rinsing, drying)?: A Lot Help from another person to put on and taking off regular upper body clothing?: None Help from another person to put on and taking off regular lower body clothing?: A Lot 6 Click Score: 18   End of Session Nurse Communication: Mobility status  Activity Tolerance: Patient limited by pain Patient left: in bed;with call bell/phone within reach;with family/visitor present  OT Visit Diagnosis: Other abnormalities of gait and mobility (R26.89);Pain Pain - Right/Left: Left Pain - part of body: Leg                Time: [id number] OT Time Calculation (min): 15 min Charges:  OT General Charges $OT Visit: 1 Visit OT Evaluation $OT Eval Moderate Complexity: 1 Mod  Marcy SirenBreanna Jamae Tison, OT [employer] Pager [phone] Office [phone]   Orlando PennerBreanna L Bertram Haddix 11/04/2018, 4:39 PM

## 2018-11-04 NOTE — H&P (View-Only) (Signed)
Reason for Consult: Dog bite to left leg Referring Physician: Chelsea Connor  Ann Randall is an 47 y.o. female.  HPI: The patient is a 47 yrs old wf here for treatment of a left leg dog bite that occurred yesterday evening.  She was watching a 7 yrs old niece when she went into the next door yard.  The patient noted a dog (pit bull) when she was calling for the child.  She was subsequently bitten in the left leg.  She was brought to the ED for treatment.  She was taken to the OR for debridement.  She has a bite of the anterior and posterior left leg.  The area is ~ 5 x 9 cm.  She currently has wet to dry dressings in place.  She has not been eating well over the past several weeks.  She does not have DM and is not a smoker.    Past Medical History:  Diagnosis Date  . Anemia     Past Surgical History:  Procedure Laterality Date  . ABDOMINAL HYSTERECTOMY    . CHOLECYSTECTOMY    . GASTRIC BYPASS    . I&D EXTREMITY Left 11/03/2018   Procedure: DEBRIDEMENT OF LEFT LOWER EXTREMITY WOUND;  Surgeon: Connor, Chelsea A, MD;  Location: [facility] OR;  Service: General;  Laterality: Left;  . TUBAL LIGATION      History reviewed. No pertinent family history.  Social History:  reports that she has never smoked. She has never used smokeless tobacco. She reports that she drinks alcohol. She reports that she does not use drugs.  Allergies: Not on File  Medications: I have reviewed the patient's current medications.  Results for orders placed or performed during the hospital encounter of 11/03/18 (from the past 48 hour(s))  CBC with Differential     Status: None   Collection Time: 11/03/18  5:55 PM  Result Value Ref Range   WBC 8.6 4.0 - 10.5 K/uL   RBC 4.44 3.87 - 5.11 MIL/uL   Hemoglobin 14.6 12.0 - 15.0 g/dL   HCT 44.4 36.0 - 46.0 %   MCV 100.0 80.0 - 100.0 fL   MCH 32.9 26.0 - 34.0 pg   MCHC 32.9 30.0 - 36.0 g/dL   RDW 12.8 11.5 - 15.5 %   Platelets 176 150 - 400 K/uL   nRBC 0.0 0.0 - 0.2 %   Neutrophils Relative % 56 %   Neutro Abs 4.8 1.7 - 7.7 K/uL   Lymphocytes Relative 38 %   Lymphs Abs 3.2 0.7 - 4.0 K/uL   Monocytes Relative 5 %   Monocytes Absolute 0.5 0.1 - 1.0 K/uL   Eosinophils Relative 1 %   Eosinophils Absolute 0.1 0.0 - 0.5 K/uL   Basophils Relative 0 %   Basophils Absolute 0.0 0.0 - 0.1 K/uL   Immature Granulocytes 0 %   Abs Immature Granulocytes 0.02 0.00 - 0.07 K/uL    Comment: Performed at [facility], [address] [place], [place] [address]  Type and screen [facility]     Status: None   Collection Time: 11/03/18  5:55 PM  Result Value Ref Range   ABO/RH(D) O POS    Antibody Screen NEG    Sample Expiration      11/06/2018 Performed at [facility], [address] [place], [place] [address]   Comprehensive metabolic panel     Status: Abnormal   Collection Time: 11/03/18  5:55 PM  Result Value   Ref Range   Sodium 140 135 - 145 mmol/L   Potassium 3.9 3.5 - 5.1 mmol/L   Chloride 103 98 - 111 mmol/L   CO2 25 22 - 32 mmol/L   Glucose, Bld 109 (H) 70 - 99 mg/dL   BUN 10 6 - 20 mg/dL   Creatinine, Ser 0.58 0.44 - 1.00 mg/dL   Calcium 7.4 (L) 8.9 - 10.3 mg/dL   Total Protein 7.1 6.5 - 8.1 g/dL   Albumin 3.6 3.5 - 5.0 g/dL   AST 54 (H) 15 - 41 U/L   ALT 44 0 - 44 U/L   Alkaline Phosphatase 80 38 - 126 U/L   Total Bilirubin 0.9 0.3 - 1.2 mg/dL   GFR calc non Af Amer >60 >60 mL/min   GFR calc Af Amer >60 >60 mL/min   Anion gap 12 5 - 15    Comment: Performed at [facility], [address] [place], [place] [address]  ABO/Rh     Status: None   Collection Time: 11/03/18  5:55 PM  Result Value Ref Range   ABO/RH(D)      O POS Performed at [facility], [address] [place], [place] [address]   I-Stat beta hCG blood, ED     Status: Abnormal   Collection Time: 11/03/18  6:05 PM  Result Value Ref Range   I-stat hCG, quantitative 12.5 (H) <5 mIU/mL   Comment 3            Comment:   GEST. AGE      CONC.   (mIU/mL)   <=1 WEEK        5 - 50     2 WEEKS       50 - 500     3 WEEKS       100 - 10,000     4 WEEKS     1,000 - 30,000        FEMALE AND NON-PREGNANT FEMALE:     LESS THAN 5 mIU/mL   CBC     Status: Abnormal   Collection Time: 11/04/18  2:12 AM  Result Value Ref Range   WBC 6.9 4.0 - 10.5 K/uL   RBC 4.39 3.87 - 5.11 MIL/uL   Hemoglobin 14.1 12.0 - 15.0 g/dL   HCT 45.1 36.0 - 46.0 %   MCV 102.7 (H) 80.0 - 100.0 fL   MCH 32.1 26.0 - 34.0 pg   MCHC 31.3 30.0 - 36.0 g/dL   RDW 12.7 11.5 - 15.5 %   Platelets 189 150 - 400 K/uL   nRBC 0.0 0.0 - 0.2 %    Comment: Performed at [facility], [address] [place], [place] [address]  Comprehensive metabolic panel     Status: Abnormal   Collection Time: 11/04/18  2:12 AM  Result Value Ref Range   Sodium 143 135 - 145 mmol/L   Potassium 4.4 3.5 - 5.1 mmol/L   Chloride 107 98 - 111 mmol/L   CO2 27 22 - 32 mmol/L   Glucose, Bld 173 (H) 70 - 99 mg/dL   BUN 11 6 - 20 mg/dL   Creatinine, Ser 0.71 0.44 - 1.00 mg/dL   Calcium 7.4 (L) 8.9 - 10.3 mg/dL   Total Protein 6.7 6.5 - 8.1 g/dL   Albumin 3.4 (L) 3.5 - 5.0 g/dL   AST 41 15 - 41 U/L   ALT 38 0 - 44 U/L   Alkaline Phosphatase 74   38 - 126 U/L   Total Bilirubin 0.6 0.3 - 1.2 mg/dL   GFR calc non Af Amer >60 >60 mL/min   GFR calc Af Amer >60 >60 mL/min   Anion gap 9 5 - 15    Comment: Performed at [facility], [address] [place], [place] [address]    Dg Tibia/fibula Left  Result Date: 11/03/2018 CLINICAL DATA:  Dog bite, laceration EXAM: LEFT TIBIA AND FIBULA - 2 VIEW COMPARISON:  None. FINDINGS: No fracture or dislocation is seen. The joint spaces are preserved. Large soft tissue laceration along the medial aspect of the mid tibia/calf. No radiopaque foreign body is seen. IMPRESSION: Large soft tissue laceration along the medial aspect of the mid tibia/calf. No fracture, dislocation, or radiopaque foreign body is seen. Electronically Signed   By: Sriyesh  Krishnan  M.D.   On: 11/03/2018 19:06    Review of Systems  Constitutional: Negative.   HENT: Negative.   Eyes: Negative.   Respiratory: Negative.   Cardiovascular: Negative.   Gastrointestinal: Negative.        Poor eating, poor appetite.  Genitourinary: Negative.   Musculoskeletal: Negative.   Skin: Negative.   Neurological: Negative.   Endo/Heme/Allergies: Does not bruise/bleed easily.  Psychiatric/Behavioral: Negative.    Blood pressure 108/70, pulse 99, temperature 98.1 F (36.7 C), temperature source Oral, resp. rate 19, height 5' 4" (1.626 m), weight 98.9 kg, SpO2 99 %. Physical Exam  Constitutional: She is oriented to person, place, and time. She appears well-developed and well-nourished.  HENT:  Head: Normocephalic and atraumatic.  Eyes: Pupils are equal, round, and reactive to light. Conjunctivae and EOM are normal.  Cardiovascular: Normal rate.  Respiratory: Effort normal. No respiratory distress.  Musculoskeletal:       Legs: Neurological: She is alert and oriented to person, place, and time.  Skin: Skin is warm.  Psychiatric: She has a normal mood and affect. Her behavior is normal. Judgment and thought content normal.    Assessment/Plan: Recommend elevation of left leg as able.  Protein drink, multivitamin daily, vit C twice a day, Zinc daily.  Continue wet to dry dressing changes.  Will plan to get her to the OR in next 1-2 days for acell and vac placement.  She can go home by my standpoint after I and D.  Williemae Muriel S Jaiya Mooradian 11/04/2018, 8:45 AM     

## 2018-11-04 NOTE — Consult Note (Signed)
Reason for Consult: Dog bite to left leg Referring Physician: [name] is an 47 y.o. female.  HPI: The patient is a 47 yrs old wf here for treatment of a left leg dog bite that occurred yesterday evening.  She was watching a 32 yrs old niece when she went into the next door yard.  The patient noted a dog (pit bull) when she was calling for the child.  She was subsequently bitten in the left leg.  She was brought to the ED for treatment.  She was taken to the OR for debridement.  She has a bite of the anterior and posterior left leg.  The area is ~ 5 x 9 cm.  She currently has wet to dry dressings in place.  She has not been eating well over the past several weeks.  She does not have DM and is not a smoker.    Past Medical History:  Diagnosis Date  . Anemia     Past Surgical History:  Procedure Laterality Date  . ABDOMINAL HYSTERECTOMY    . CHOLECYSTECTOMY    . GASTRIC BYPASS    . I&D EXTREMITY Left 11/03/2018   Procedure: DEBRIDEMENT OF LEFT LOWER EXTREMITY WOUND;  Surgeon: Berna Bue, MD;  Location: [facility] OR;  Service: General;  Laterality: Left;  . TUBAL LIGATION      History reviewed. No pertinent family history.  Social History:  reports that she has never smoked. She has never used smokeless tobacco. She reports that she drinks alcohol. She reports that she does not use drugs.  Allergies: Not on File  Medications: I have reviewed the patient's current medications.  Results for orders placed or performed during the hospital encounter of 11/03/18 (from the past 48 hour(s))  CBC with Differential     Status: None   Collection Time: 11/03/18  5:55 PM  Result Value Ref Range   WBC 8.6 4.0 - 10.5 K/uL   RBC 4.44 3.87 - 5.11 MIL/uL   Hemoglobin 14.6 12.0 - 15.0 g/dL   HCT 40.9 81.1 - 91.4 %   MCV 100.0 80.0 - 100.0 fL   MCH 32.9 26.0 - 34.0 pg   MCHC 32.9 30.0 - 36.0 g/dL   RDW 78.2 95.6 - 21.3 %   Platelets 176 150 - 400 K/uL   nRBC 0.0 0.0 - 0.2 %   Neutrophils Relative % 56 %   Neutro Abs 4.8 1.7 - 7.7 K/uL   Lymphocytes Relative 38 %   Lymphs Abs 3.2 0.7 - 4.0 K/uL   Monocytes Relative 5 %   Monocytes Absolute 0.5 0.1 - 1.0 K/uL   Eosinophils Relative 1 %   Eosinophils Absolute 0.1 0.0 - 0.5 K/uL   Basophils Relative 0 %   Basophils Absolute 0.0 0.0 - 0.1 K/uL   Immature Granulocytes 0 %   Abs Immature Granulocytes 0.02 0.00 - 0.07 K/uL    Comment: Performed at [facility], [address] [place], [place] [address]  Type and screen [facility]     Status: None   Collection Time: 11/03/18  5:55 PM  Result Value Ref Range   ABO/RH(D) O POS    Antibody Screen NEG    Sample Expiration      11/06/2018 Performed at [facility], [address], [place], [place] [address]   Comprehensive metabolic panel     Status: Abnormal   Collection Time: 11/03/18  5:55 PM  Result Value  Ref Range   Sodium 140 135 - 145 mmol/L   Potassium 3.9 3.5 - 5.1 mmol/L   Chloride 103 98 - 111 mmol/L   CO2 25 22 - 32 mmol/L   Glucose, Bld 109 (H) 70 - 99 mg/dL   BUN 10 6 - 20 mg/dL   Creatinine, Ser 1.610.58 0.44 - 1.00 mg/dL   Calcium 7.4 (L) 8.9 - 10.3 mg/dL   Total Protein 7.1 6.5 - 8.1 g/dL   Albumin 3.6 3.5 - 5.0 g/dL   AST 54 (H) 15 - 41 U/L   ALT 44 0 - 44 U/L   Alkaline Phosphatase 80 38 - 126 U/L   Total Bilirubin 0.9 0.3 - 1.2 mg/dL   GFR calc non Af Amer >60 >60 mL/min   GFR calc Af Amer >60 >60 mL/min   Anion gap 12 5 - 15    Comment: Performed at [facility], [address] [place], [place] [address]  ABO/Rh     Status: None   Collection Time: 11/03/18  5:55 PM  Result Value Ref Range   ABO/RH(D)      O POS Performed at [facility], [address] [place], [place] [address]   I-Stat beta hCG blood, ED     Status: Abnormal   Collection Time: 11/03/18  6:05 PM  Result Value Ref Range   I-stat hCG, quantitative 12.5 (H) <5 mIU/mL   Comment 3            Comment:   GEST. AGE      CONC.   (mIU/mL)   <=1 WEEK        5 - 50     2 WEEKS       50 - 500     3 WEEKS       100 - 10,000     4 WEEKS     1,000 - 30,000        FEMALE AND NON-PREGNANT FEMALE:     LESS THAN 5 mIU/mL   CBC     Status: Abnormal   Collection Time: 11/04/18  2:12 AM  Result Value Ref Range   WBC 6.9 4.0 - 10.5 K/uL   RBC 4.39 3.87 - 5.11 MIL/uL   Hemoglobin 14.1 12.0 - 15.0 g/dL   HCT 11.945.1 14.736.0 - 82.946.0 %   MCV 102.7 (H) 80.0 - 100.0 fL   MCH 32.1 26.0 - 34.0 pg   MCHC 31.3 30.0 - 36.0 g/dL   RDW 56.212.7 13.011.5 - 86.515.5 %   Platelets 189 150 - 400 K/uL   nRBC 0.0 0.0 - 0.2 %    Comment: Performed at [facility], [address] [place], [place] [address]  Comprehensive metabolic panel     Status: Abnormal   Collection Time: 11/04/18  2:12 AM  Result Value Ref Range   Sodium 143 135 - 145 mmol/L   Potassium 4.4 3.5 - 5.1 mmol/L   Chloride 107 98 - 111 mmol/L   CO2 27 22 - 32 mmol/L   Glucose, Bld 173 (H) 70 - 99 mg/dL   BUN 11 6 - 20 mg/dL   Creatinine, Ser 6.290.71 0.44 - 1.00 mg/dL   Calcium 7.4 (L) 8.9 - 10.3 mg/dL   Total Protein 6.7 6.5 - 8.1 g/dL   Albumin 3.4 (L) 3.5 - 5.0 g/dL   AST 41 15 - 41 U/L   ALT 38 0 - 44 U/L   Alkaline Phosphatase 74  38 - 126 U/L   Total Bilirubin 0.6 0.3 - 1.2 mg/dL   GFR calc non Af Amer >60 >60 mL/min   GFR calc Af Amer >60 >60 mL/min   Anion gap 9 5 - 15    Comment: Performed at [facility], [address] [place], [place] [address]    Dg Tibia/fibula Left  Result Date: 11/03/2018 CLINICAL DATA:  Dog bite, laceration EXAM: LEFT TIBIA AND FIBULA - 2 VIEW COMPARISON:  None. FINDINGS: No fracture or dislocation is seen. The joint spaces are preserved. Large soft tissue laceration along the medial aspect of the mid tibia/calf. No radiopaque foreign body is seen. IMPRESSION: Large soft tissue laceration along the medial aspect of the mid tibia/calf. No fracture, dislocation, or radiopaque foreign body is seen. Electronically Signed   By: Charline Bills  M.D.   On: 11/03/2018 19:06    Review of Systems  Constitutional: Negative.   HENT: Negative.   Eyes: Negative.   Respiratory: Negative.   Cardiovascular: Negative.   Gastrointestinal: Negative.        Poor eating, poor appetite.  Genitourinary: Negative.   Musculoskeletal: Negative.   Skin: Negative.   Neurological: Negative.   Endo/Heme/Allergies: Does not bruise/bleed easily.  Psychiatric/Behavioral: Negative.    Blood pressure 108/70, pulse 99, temperature 98.1 F (36.7 C), temperature source Oral, resp. rate 19, height 5\' 4"  (1.626 m), weight 98.9 kg, SpO2 99 %. Physical Exam  Constitutional: She is oriented to person, place, and time. She appears well-developed and well-nourished.  HENT:  Head: Normocephalic and atraumatic.  Eyes: Pupils are equal, round, and reactive to light. Conjunctivae and EOM are normal.  Cardiovascular: Normal rate.  Respiratory: Effort normal. No respiratory distress.  Musculoskeletal:       Legs: Neurological: She is alert and oriented to person, place, and time.  Skin: Skin is warm.  Psychiatric: She has a normal mood and affect. Her behavior is normal. Judgment and thought content normal.    Assessment/Plan: Recommend elevation of left leg as able.  Protein drink, multivitamin daily, vit C twice a day, Zinc daily.  Continue wet to dry dressing changes.  Will plan to get her to the OR in next 1-2 days for acell and vac placement.  She can go home by my standpoint after I and D.  Ann Randall 11/04/2018, 8:45 AM

## 2018-11-04 NOTE — Care Management Note (Addendum)
Case Management Note  Patient Details  Name: Ann Randall MRN: [id number] Date of Birth: 10/20/1971  Subjective/Objective:                    Action/Plan:  Discussed discharge planning with patient at bedside. Patient states she just moved her with her sister. Patient confirmed she she no insurance.   Previous PCP is DR Rose Fillersebecca Fairchild at [facility], [address], [place], [place], [address] phone [phone].  Patient's address is : [address], [place], [place] [address].  Patient's cell [phone]  Sister's cell [name] [phone].  Sister [name] currently in room with patient.   [facility] started. Paged Dr Ulice Boldillingham for signature.  [employer] VAC form placed on chart with charity application.   [facility] for Home health RN [facility].  [facility] 14, 2018 at 0950 am.     Patient and sister voiced understanding and agreement to all of above.   Sp[facility] with [facility] PA she has completed VAC application and faxed it to [place] at [facility].   NCM completed Application for [facility] and faxed it to [place] at [facility].   Expected Discharge Date:                  Expected Discharge Plan:  Home w Home Health Services  In-House Referral:     Discharge planning Services  CM Consult  Post Acute Care Choice:  Durable Medical Equipment, Home Health Choice offered to:  Patient  DME Arranged:  Vac DME Agency:  [employer]  HH Arranged:  RN HH Agency:  [facility]  Status of Service:     If discussed at [employer] Length of Stay Meetings, dates discussed:    Additional Comments:  Kingsley PlanWile, Marcie Shearon Marie, RN 11/04/2018, 11:02 AM

## 2018-11-04 NOTE — Evaluation (Signed)
Physical Therapy Evaluation Patient Details Name: Ann Randall MRN: [id number] DOB: 12/18/1970 Today's Date: 11/04/2018   History of Present Illness  Pt presents with dog bite to L lower leg. PMH: anemia, gastric bypass, cholecystectomy  Clinical Impression  Pt admitted with above diagnosis. Pt currently with functional limitations due to the deficits listed below (see PT Problem List). Pt presents with pain with mvmt at L ankle and hip but especially knee. Given ROM and strengthening exercises for BLE's. She is experiencing some R posterior LE and hip pain as well from where she fell. Ambulated 10' with RW at which point pt was closing her eyes with increased sway and was unable to tolerate pain effectively to keep going safely. Needs continued encouragement but do not anticipate her needing PT after dc.  Pt will benefit from skilled PT to increase their independence and safety with mobility to allow discharge to the venue listed below.       Follow Up Recommendations No PT follow up    Equipment Recommendations  Rolling walker with 5" wheels    Recommendations for Other Services       Precautions / Restrictions Precautions Precautions: None Restrictions Weight Bearing Restrictions: No      Mobility  Bed Mobility               General bed mobility comments: pt received in chair  Transfers Overall transfer level: Needs assistance Equipment used: Rolling walker (2 wheeled) Transfers: Sit to/from Stand Sit to Stand: Min guard         General transfer comment: pt stood with great effort and increased time with vc's for hand placement on chair, close guarding needed for safety but no physical assist given, vc's for fwd wt shift  Ambulation/Gait Ambulation/Gait assistance: Min assist Gait Distance (Feet): 10 Feet Assistive device: Rolling walker (2 wheeled) Gait Pattern/deviations: Step-to pattern;Decreased weight shift to left;Decreased step length - left;Decreased  stance time - left Gait velocity: decreased Gait velocity interpretation: <1.31 ft/sec, indicative of household ambulator General Gait Details: vc's for sequencing for pain control as well as use of triceps instead of traps. pt's ambulation goal was 9820' but at 5010' she began to close eyes and sway and reported that she was hurting badly. [facility] and had her sit.   Stairs            Wheelchair Mobility    Modified Rankin (Stroke Patients Only)       Balance Overall balance assessment: Mild deficits observed, not formally tested                                           Pertinent Vitals/Pain Pain Assessment: Faces Faces Pain Scale: Hurts even more Pain Location: LLE Pain Descriptors / Indicators: Aching;Burning Pain Intervention(s): Limited activity within patient's tolerance;Monitored during session;Premedicated before session    Home Living Family/patient expects to be discharged to:: Private residence Living Arrangements: Other relatives Available Help at Discharge: Family;Available PRN/intermittently Type of Home: House Home Access: Stairs to enter Entrance Stairs-Rails: None Entrance Stairs-Number of Steps: 3 Home Layout: One level Home Equipment: None Additional Comments: pt lives with her sister, [place]    Prior Function Level of Independence: Independent               Hand Dominance  Extremity/Trunk Assessment   Upper Extremity Assessment Upper Extremity Assessment: Defer to OT evaluation    Lower Extremity Assessment Lower Extremity Assessment: LLE deficits/detail;RLE deficits/detail RLE Deficits / Details: pt has some soreness posterior R calf as well as R hip, she fell on this side when the dog bit her RLE Sensation: WNL RLE Coordination: WNL LLE Deficits / Details: hip flex 3/5, knee ext 3/5, knee flex 3/5, all mvmt painful. Swelling noted lateral knee and bruising  central patellar region LLE: Unable to fully assess due to pain LLE Sensation: WNL LLE Coordination: WNL    Cervical / Trunk Assessment Cervical / Trunk Assessment: Normal  Communication   Communication: No difficulties  Cognition Arousal/Alertness: Awake/alert Behavior During Therapy: WFL for tasks assessed/performed Overall Cognitive Status: Within Functional Limits for tasks assessed                                        General Comments      Exercises General Exercises - Lower Extremity Ankle Circles/Pumps: AROM;Both;10 reps;Seated Long Arc Quad: AROM;Both;5 reps;Seated Heel Slides: AROM;5 reps;Seated;Left Hip ABduction/ADduction: AROM;Left;5 reps;Seated Straight Leg Raises: AROM;Left;5 reps;Seated   Assessment/Plan    PT Assessment Patient needs continued PT services  PT Problem List Decreased strength;Decreased range of motion;Decreased activity tolerance;Decreased balance;Decreased mobility;Decreased knowledge of use of DME;Decreased knowledge of precautions;Pain       PT Treatment Interventions DME instruction;Gait training;Stair training;Functional mobility training;Therapeutic activities;Therapeutic exercise;Balance training;Patient/family education    PT Goals (Current goals can be found in the Care Plan section)  Acute Rehab PT Goals Patient Stated Goal: return home, decrease pain PT Goal Formulation: With patient Time For Goal Achievement: 11/18/18 Potential to Achieve Goals: Good    Frequency Min 3X/week   Barriers to discharge        Co-evaluation               AM-PAC PT "6 Clicks" Mobility  Outcome Measure Help needed turning from your back to your side while in a flat bed without using bedrails?: A Little Help needed moving from lying on your back to sitting on the side of a flat bed without using bedrails?: A Little Help needed moving to and from a bed to a chair (including a wheelchair)?: A Little Help needed standing  up from a chair using your arms (e.g., wheelchair or bedside chair)?: A Little Help needed to walk in hospital room?: A Little Help needed climbing 3-5 steps with a railing? : A Lot 6 Click Score: 17    End of Session Equipment Utilized During Treatment: Gait belt Activity Tolerance: Patient limited by pain Patient left: in bed;with call bell/phone within reach;with family/visitor present Nurse Communication: Mobility status PT Visit Diagnosis: Unsteadiness on feet (R26.81);Pain;Difficulty in walking, not elsewhere classified (R26.2) Pain - Right/Left: Left Pain - part of body: Leg    Time: 0454-0981 PT Time Calculation (min) (ACUTE ONLY): 28 min   Charges:   PT Evaluation $PT Eval Moderate Complexity: 1 Mod PT Treatments $Therapeutic Exercise: 8-22 mins        Lyanne Co, PT  Acute Rehab Services  Pager [phone] Office [phone]   Lawana Chambers Celita Aron 11/04/2018, 1:08 PM

## 2018-11-04 NOTE — Progress Notes (Signed)
Attempt to call report nurse unavailable

## 2018-11-05 ENCOUNTER — Encounter (HOSPITAL_COMMUNITY): Payer: Self-pay

## 2018-11-05 ENCOUNTER — Encounter (HOSPITAL_COMMUNITY): Admission: EM | Disposition: A | Discharge status: Home Health | Payer: Self-pay | Source: Home / Self Care

## 2018-11-05 ENCOUNTER — Inpatient Hospital Stay (HOSPITAL_COMMUNITY): Payer: Self-pay | Admitting: Anesthesiology

## 2018-11-05 ENCOUNTER — Observation Stay (HOSPITAL_COMMUNITY): Payer: Self-pay

## 2018-11-05 DIAGNOSIS — S81852A Open bite, left lower leg, initial encounter: Secondary | ICD-10-CM

## 2018-11-05 DIAGNOSIS — W540XXA Bitten by dog, initial encounter: Secondary | ICD-10-CM

## 2018-11-05 HISTORY — PX: I & D EXTREMITY: SHX5045

## 2018-11-05 HISTORY — PX: APPLICATION OF WOUND VAC: SHX5189

## 2018-11-05 LAB — CBC
HCT: 38.9 % (ref 36.0–46.0)
Hemoglobin: 12.3 g/dL (ref 12.0–15.0)
MCH: 33 pg (ref 26.0–34.0)
MCHC: 31.6 g/dL (ref 30.0–36.0)
MCV: 104.3 fL — ABNORMAL HIGH (ref 80.0–100.0)
Platelets: 175 10*3/uL (ref 150–400)
RBC: 3.73 MIL/uL — ABNORMAL LOW (ref 3.87–5.11)
RDW: 12.9 % (ref 11.5–15.5)
WBC: 9.3 10*3/uL (ref 4.0–10.5)
nRBC: 0 % (ref 0.0–0.2)

## 2018-11-05 LAB — BASIC METABOLIC PANEL
Anion gap: 11 (ref 5–15)
BUN: 22 mg/dL — ABNORMAL HIGH (ref 6–20)
CO2: 27 mmol/L (ref 22–32)
Calcium: 7 mg/dL — ABNORMAL LOW (ref 8.9–10.3)
Chloride: 102 mmol/L (ref 98–111)
Creatinine, Ser: 0.98 mg/dL (ref 0.44–1.00)
GFR calc Af Amer: >60 mL/min (ref >60)
GFR calc non Af Amer: >60 mL/min (ref >60)
Glucose, Bld: 125 mg/dL — ABNORMAL HIGH (ref 70–99)
Potassium: 4.3 mmol/L (ref 3.5–5.1)
Sodium: 140 mmol/L (ref 135–145)

## 2018-11-05 LAB — TSH: TSH: 0.019 u[IU]/mL — ABNORMAL LOW (ref 0.350–4.500)

## 2018-11-05 LAB — T4, FREE: Free T4: 1.22 ng/dL (ref 0.82–1.77)

## 2018-11-05 LAB — SURGICAL PCR SCREEN
MRSA, PCR: NEGATIVE
Staphylococcus aureus: NEGATIVE

## 2018-11-05 SURGERY — IRRIGATION AND DEBRIDEMENT EXTREMITY
Anesthesia: General | Laterality: Left

## 2018-11-05 MED ORDER — ONDANSETRON HCL 4 MG/2ML IJ SOLN
INTRAMUSCULAR | Status: AC
  Filled 2018-11-05: qty 2

## 2018-11-05 MED ORDER — OXYCODONE HCL 5 MG/5ML PO SOLN: 5.0000 mg | Freq: Once | ORAL | Status: DC | PRN

## 2018-11-05 MED ORDER — DEXAMETHASONE SODIUM PHOSPHATE 10 MG/ML IJ SOLN
INTRAMUSCULAR | Status: AC
  Filled 2018-11-05: qty 1

## 2018-11-05 MED ORDER — OXYCODONE HCL 5 MG PO TABS: 5.0000 mg | ORAL_TABLET | Freq: Once | ORAL | Status: DC | PRN

## 2018-11-05 MED ORDER — PHENYLEPHRINE 40 MCG/ML (10ML) SYRINGE FOR IV PUSH (FOR BLOOD PRESSURE SUPPORT)
PREFILLED_SYRINGE | INTRAVENOUS | Status: AC
  Filled 2018-11-05: qty 10

## 2018-11-05 MED ORDER — FENTANYL CITRATE (PF) 100 MCG/2ML IJ SOLN
25.0000 ug | INTRAMUSCULAR | Status: DC | PRN
  Administered 2018-11-05 (×2): 50 ug via INTRAVENOUS

## 2018-11-05 MED ORDER — SODIUM CHLORIDE 0.9 % IR SOLN
Status: DC | PRN
  Administered 2018-11-05: 1000 mL

## 2018-11-05 MED ORDER — ONDANSETRON HCL 4 MG/2ML IJ SOLN
INTRAMUSCULAR | Status: DC | PRN
  Administered 2018-11-05: 4 mg via INTRAVENOUS

## 2018-11-05 MED ORDER — EPHEDRINE SULFATE 50 MG/ML IJ SOLN
INTRAMUSCULAR | Status: DC | PRN
  Administered 2018-11-05 (×2): 10 mg via INTRAVENOUS

## 2018-11-05 MED ORDER — FENTANYL CITRATE (PF) 100 MCG/2ML IJ SOLN
INTRAMUSCULAR | Status: AC
  Filled 2018-11-05: qty 2

## 2018-11-05 MED ORDER — LEVOTHYROXINE SODIUM 100 MCG PO TABS
300.0000 ug | ORAL_TABLET | Freq: Every day | ORAL | Status: DC
  Administered 2018-11-05: 300 ug via ORAL
  Administered 2018-11-06: 250 ug via ORAL
  Filled 2018-11-05 (×2): qty 3

## 2018-11-05 MED ORDER — SODIUM CHLORIDE 0.9 % IV SOLN
INTRAVENOUS | Status: AC
  Filled 2018-11-05: qty 500000

## 2018-11-05 MED ORDER — MIDAZOLAM HCL 5 MG/5ML IJ SOLN
INTRAMUSCULAR | Status: DC | PRN
  Administered 2018-11-05: 2 mg via INTRAVENOUS

## 2018-11-05 MED ORDER — EPHEDRINE 5 MG/ML INJ
INTRAVENOUS | Status: AC
  Filled 2018-11-05: qty 10

## 2018-11-05 MED ORDER — ONDANSETRON HCL 4 MG/2ML IJ SOLN: 4.0000 mg | Freq: Once | INTRAMUSCULAR | Status: DC | PRN

## 2018-11-05 MED ORDER — SODIUM CHLORIDE 0.9 % IV SOLN
INTRAVENOUS | Status: DC | PRN
  Administered 2018-11-05: 500 mL

## 2018-11-05 MED ORDER — OXYCODONE HCL 5 MG PO TABS
10.0000 mg | ORAL_TABLET | ORAL | Status: DC | PRN
  Administered 2018-11-05 – 2018-11-07 (×11): 15 mg via ORAL
  Filled 2018-11-05 (×11): qty 3

## 2018-11-05 MED ORDER — DM-GUAIFENESIN ER 30-600 MG PO TB12
1.0000 | ORAL_TABLET | Freq: Two times a day (BID) | ORAL | Status: DC
  Administered 2018-11-05 – 2018-11-07 (×5): 1 via ORAL
  Filled 2018-11-05 (×5): qty 1

## 2018-11-05 MED ORDER — PROMETHAZINE HCL 25 MG/ML IJ SOLN: 12.5000 mg | INTRAMUSCULAR | Status: DC | PRN

## 2018-11-05 MED ORDER — PROPOFOL 10 MG/ML IV BOLUS
INTRAVENOUS | Status: AC
  Filled 2018-11-05: qty 20

## 2018-11-05 MED ORDER — LIDOCAINE HCL (CARDIAC) PF 100 MG/5ML IV SOSY
PREFILLED_SYRINGE | INTRAVENOUS | Status: DC | PRN
  Administered 2018-11-05: 100 mg via INTRAVENOUS

## 2018-11-05 MED ORDER — PROPOFOL 10 MG/ML IV BOLUS
INTRAVENOUS | Status: DC | PRN
  Administered 2018-11-05: 150 mg via INTRAVENOUS

## 2018-11-05 MED ORDER — FENTANYL CITRATE (PF) 250 MCG/5ML IJ SOLN
INTRAMUSCULAR | Status: AC
  Filled 2018-11-05: qty 5

## 2018-11-05 MED ORDER — MIDAZOLAM HCL 2 MG/2ML IJ SOLN
INTRAMUSCULAR | Status: AC
  Filled 2018-11-05: qty 2

## 2018-11-05 MED ORDER — DEXAMETHASONE SODIUM PHOSPHATE 10 MG/ML IJ SOLN
INTRAMUSCULAR | Status: DC | PRN
  Administered 2018-11-05: 4 mg via INTRAVENOUS

## 2018-11-05 MED ORDER — CALCIUM GLUCONATE-NACL 2-0.675 GM/100ML-% IV SOLN
2.0000 g | Freq: Once | INTRAVENOUS | Status: AC
  Administered 2018-11-05: 2000 mg via INTRAVENOUS
  Filled 2018-11-05: qty 100

## 2018-11-05 MED ORDER — LACTATED RINGERS IV SOLN
INTRAVENOUS | Status: DC
  Administered 2018-11-05: 16:00:00 via INTRAVENOUS

## 2018-11-05 MED ORDER — LACTATED RINGERS IV SOLN
INTRAVENOUS | Status: DC | PRN
  Administered 2018-11-05: 16:00:00 via INTRAVENOUS

## 2018-11-05 MED ORDER — FENTANYL CITRATE (PF) 100 MCG/2ML IJ SOLN
INTRAMUSCULAR | Status: DC | PRN
  Administered 2018-11-05: 100 ug via INTRAVENOUS

## 2018-11-05 SURGICAL SUPPLY — 38 items
BLADE CLIPPER SURG (BLADE) IMPLANT
BNDG ELASTIC 6X10 VLCR STRL LF (GAUZE/BANDAGES/DRESSINGS) ×4 IMPLANT
BNDG GAUZE ELAST 4 BULKY (GAUZE/BANDAGES/DRESSINGS) ×4 IMPLANT
CANISTER SUCT 3000ML PPV (MISCELLANEOUS) ×3 IMPLANT
CHLORAPREP W/TINT 26ML (MISCELLANEOUS) IMPLANT
COVER SURGICAL LIGHT HANDLE (MISCELLANEOUS) ×3 IMPLANT
DRAPE HALF SHEET 40X57 (DRAPES) IMPLANT
DRAPE INCISE IOBAN 66X45 STRL (DRAPES) IMPLANT
DRAPE ORTHO SPLIT 77X108 STRL (DRAPES)
DRAPE SURG ORHT 6 SPLT 77X108 (DRAPES) IMPLANT
DRESSING HYDROCOLLOID 4X4 (GAUZE/BANDAGES/DRESSINGS) ×3 IMPLANT
DRSG ADAPTIC 3X8 NADH LF (GAUZE/BANDAGES/DRESSINGS) IMPLANT
DRSG PAD ABDOMINAL 8X10 ST (GAUZE/BANDAGES/DRESSINGS) ×2 IMPLANT
DRSG VAC ATS MED SENSATRAC (GAUZE/BANDAGES/DRESSINGS) ×2 IMPLANT
ELECT REM PT RETURN 9FT ADLT (ELECTROSURGICAL) ×3
ELECTRODE REM PT RTRN 9FT ADLT (ELECTROSURGICAL) ×1 IMPLANT
GAUZE SPONGE 4X4 12PLY STRL (GAUZE/BANDAGES/DRESSINGS) IMPLANT
GEL ULTRASOUND 20GR AQUASONIC (MISCELLANEOUS) IMPLANT
GLOVE BIO SURGEON STRL SZ 6.5 (GLOVE) ×4 IMPLANT
GLOVE BIO SURGEONS STRL SZ 6.5 (GLOVE) ×2
GOWN STRL REUS W/ TWL LRG LVL3 (GOWN DISPOSABLE) ×2 IMPLANT
GOWN STRL REUS W/TWL LRG LVL3 (GOWN DISPOSABLE) ×4
KIT BASIN OR (CUSTOM PROCEDURE TRAY) ×3 IMPLANT
KIT TURNOVER KIT B (KITS) ×3 IMPLANT
MATRIX WOUND 3-LAYER 10X15 (Tissue) ×1 IMPLANT
MICROMATRIX 1000MG (Tissue) ×6 IMPLANT
NS IRRIG 1000ML POUR BTL (IV SOLUTION) ×3 IMPLANT
PACK GENERAL/GYN (CUSTOM PROCEDURE TRAY) ×3 IMPLANT
PAD ARMBOARD 7.5X6 YLW CONV (MISCELLANEOUS) ×6 IMPLANT
PAD NEG PRESSURE SENSATRAC (MISCELLANEOUS) IMPLANT
SOLUTION PARTIC MCRMTRX 1000MG (Tissue) IMPLANT
SUT SILK 4 0 PS 2 (SUTURE) IMPLANT
SUT VIC AB 5-0 PS2 18 (SUTURE) ×6 IMPLANT
TOWEL OR 17X24 6PK STRL BLUE (TOWEL DISPOSABLE) IMPLANT
TOWEL OR 17X26 10 PK STRL BLUE (TOWEL DISPOSABLE) ×3 IMPLANT
UNDERPAD 30X30 (UNDERPADS AND DIAPERS) IMPLANT
WND VAC CANISTER 500ML (MISCELLANEOUS) ×2 IMPLANT
WOUND MATRIX 3-LAYER 10X15 (Tissue) ×1 IMPLANT

## 2018-11-05 NOTE — Progress Notes (Signed)
[facility] Progress Note  2 Days Post-Op  Subjective: CC-  Patient states that she continues to have severe pain in the LLE. Current pain medication regimen is not helping. She also reports increased knee/ankle pain during ambulation, thinks that she may have twisted the knee when the dog bit her. Feels like the knee is going to give way with ambulation. Pain is lateral.  States that she has issues with chronic hypocalcemia since thyroidectomy. She is starting to have some mild paresthesias in her fingers/toes.  Objective: Vital signs in last 24 hours: Temp:  [97.2 F (36.2 C)-97.8 F (36.6 C)] 97.2 F (36.2 C) (12/10 0536) Pulse Rate:  [63-78] 63 (12/10 0536) Resp:  [16-20] 16 (12/10 0536) BP: (92-109)/(58-73) 109/73 (12/10 0536) SpO2:  [92 %-93 %] 93 % (12/10 0536) Last BM Date: 11/03/18  Intake/Output from previous day: 12/09 0701 - 12/10 0700 In: 800 [P.O.:800] Out: -  Intake/Output this shift: No intake/output data recorded.  PE: Gen:  Alert, NAD, pleasant HEENT: EOM's intact, pupils equal and round Card:  RRR, 2+ DP pulses bilaterally Pulm:  few rhonchi bilaterally, no wheezes, effort normal, pulling 1300 on IS Abd: Soft, NT/ND, +BS, no HSM Psych: A&Ox3  Skin: warm and dry LLE: cdi dressing to lower leg. Mild edema in knee and ankle. Foot WWP, no gross sensory or motor deficits. TTP lateral knee and globally about the ankle with most significant tenderness laterally  Lab Results:  Recent Labs    11/04/18 0212 11/05/18 0215  WBC 6.9 9.3  HGB 14.1 12.3  HCT 45.1 38.9  PLT 189 175   BMET Recent Labs    11/04/18 0212 11/05/18 0215  NA 143 140  K 4.4 4.3  CL 107 102  CO2 27 27  GLUCOSE 173* 125*  BUN 11 22*  CREATININE 0.71 0.98  CALCIUM 7.4* 7.0*   PT/INR No results for input(s): LABPROT, INR in the last 72 hours. CMP     Component Value Date/Time   NA 140 11/05/2018 0215   K 4.3 11/05/2018 0215   CL 102 11/05/2018 0215   CO2 27  11/05/2018 0215   GLUCOSE 125 (H) 11/05/2018 0215   BUN 22 (H) 11/05/2018 0215   CREATININE 0.98 11/05/2018 0215   CALCIUM 7.0 (L) 11/05/2018 0215   PROT 6.7 11/04/2018 0212   ALBUMIN 3.4 (L) 11/04/2018 0212   AST 41 11/04/2018 0212   ALT 38 11/04/2018 0212   ALKPHOS 74 11/04/2018 0212   BILITOT 0.6 11/04/2018 0212   GFRNONAA >60 11/05/2018 0215   GFRAA >60 11/05/2018 0215   Lipase  No results found for: LIPASE     Studies/Results: Dg Tibia/fibula Left  Result Date: 11/03/2018 CLINICAL DATA:  Dog bite, laceration EXAM: LEFT TIBIA AND FIBULA - 2 VIEW COMPARISON:  None. FINDINGS: No fracture or dislocation is seen. The joint spaces are preserved. Large soft tissue laceration along the medial aspect of the mid tibia/calf. No radiopaque foreign body is seen. IMPRESSION: Large soft tissue laceration along the medial aspect of the mid tibia/calf. No fracture, dislocation, or radiopaque foreign body is seen. Electronically Signed   By: Charline Bills M.D.   On: 11/03/2018 19:06    Anti-infectives: Anti-infectives (From admission, onward)   Start     Dose/Rate Route Frequency Ordered Stop   11/05/18 0600  ceFAZolin (ANCEF) IVPB 2g/100 mL premix     2 g 200 mL/hr over 30 Minutes Intravenous On call to O.R. 11/04/18 1619 11/06/18 0559   11/04/18  0230  Ampicillin-Sulbactam (UNASYN) 3 g in sodium chloride 0.9 % 100 mL IVPB    Note to Pharmacy:  Adjust dose as indicated, coverage for dog bite   3 g 200 mL/hr over 30 Minutes Intravenous Every 6 hours 11/03/18 1816     11/03/18 1730  Ampicillin-Sulbactam (UNASYN) 3 g in sodium chloride 0.9 % 100 mL IVPB     3 g 200 mL/hr over 30 Minutes Intravenous  Once 11/03/18 1729 11/03/18 1901       Assessment/Plan Dog bite Complex wound of left lower extremity - s/p debridement with partial closure 12/8 Dr. Fredricka Bonineonnor. To OR today with Dr. Ulice Boldillingham Left knee/ankle pain - check xrays Hypothyroidism - home synthroid. Check TSH and  T3/T4 Hypocalcemia - Ca 7, replace with IV calcium gluconate Depression - home zoloft H/o gastric bypass Elevated Hcg - per patient this is chronic  ID - unasyn 12/8>> FEN - IVF, NPO for procedure VTE - SCDs, sq heparin Foley - none  Plan: Add mucinex BID. Increase oxy scale 10-15mg . OR today with Dr. Ulice Boldillingham. Labs in AM.   LOS: 0 days    Franne FortsBrooke A Meuth , [facility] 11/05/2018, 8:00 AM Pager: [phone] Mon 7:00 am -11:30 AM Tues-Fri 7:00 am-4:30 pm Sat-Sun 7:00 am-11:30 am

## 2018-11-05 NOTE — Anesthesia Preprocedure Evaluation (Signed)
Anesthesia Evaluation  Patient identified by MRN, date of birth, ID band Patient awake    Reviewed: Allergy & Precautions, NPO status , Patient's Chart, lab work & pertinent test results  History of Anesthesia Complications Negative for: history of anesthetic complications  Airway Mallampati: II  TM Distance: >3 FB Neck ROM: Full    Dental no notable dental hx.    Pulmonary neg pulmonary ROS,    Pulmonary exam normal        Cardiovascular negative cardio ROS Normal cardiovascular exam     Neuro/Psych PSYCHIATRIC DISORDERS Depression negative neurological ROS     GI/Hepatic negative GI ROS, Neg liver ROS,   Endo/Other  Hypothyroidism   Renal/GU negative Renal ROS  negative genitourinary   Musculoskeletal negative musculoskeletal ROS (+)   Abdominal   Peds  Hematology negative hematology ROS (+)   Anesthesia Other Findings 47 yo F for I&D/wound vac - depression, hypothyroid, s/p gastric bypass, elevated hcg  Reproductive/Obstetrics Pt states pregnancy tests are always positive on her, but she is s/p hysterectomy                             Anesthesia Physical Anesthesia Plan  ASA: II  Anesthesia Plan: General   Post-op Pain Management:    Induction: Intravenous  PONV Risk Score and Plan: 3 and Ondansetron, Dexamethasone, Midazolam and Treatment may vary due to age or medical condition  Airway Management Planned: LMA  Additional Equipment: None  Intra-op Plan:   Post-operative Plan: Extubation in OR  Informed Consent: I have reviewed the patients History and Physical, chart, labs and discussed the procedure including the risks, benefits and alternatives for the proposed anesthesia with the patient or authorized representative who has indicated his/her understanding and acceptance.     Plan Discussed with:   Anesthesia Plan Comments:         Anesthesia Quick  Evaluation

## 2018-11-05 NOTE — Anesthesia Postprocedure Evaluation (Signed)
Anesthesia Post Note  Patient: Ann Randall  Procedure(s) Performed: IRRIGATION AND DEBRIDEMENT OF LEG WITH A-CELL (Left ) APPLICATION OF WOUND VAC (Left )     Patient location during evaluation: PACU Anesthesia Type: General Level of consciousness: sedated Pain management: pain level controlled Vital Signs Assessment: post-procedure vital signs reviewed and stable Respiratory status: spontaneous breathing and respiratory function stable Cardiovascular status: stable Postop Assessment: no apparent nausea or vomiting Anesthetic complications: no    Last Vitals:  Vitals:   11/05/18 1802 11/05/18 1821  BP: 135/79 (!) 91/53  Pulse: 80 75  Resp: 12 14  Temp: 36.5 C 36.8 C  SpO2: 92% 90%    Last Pain:  Vitals:   11/05/18 1745  TempSrc:   PainSc: 10-Worst pain ever                 Timaya Bojarski DANIEL

## 2018-11-05 NOTE — Progress Notes (Signed)
Transferred back from PACU, alert, oriented.left leg wound vac in situ

## 2018-11-05 NOTE — Op Note (Signed)
DATE OF OPERATION: 11/05/2018  [facility]  PREOPERATIVE DIAGNOSIS: left leg dog bite  POSTOPERATIVE DIAGNOSIS: Same  PROCEDURE:  1. Preparation of left leg dog bite wound 9 x 12 cm for 2. Placement of acell (10 x 15 cm sheet and 2 gm) 3. Placement of VAC  SURGEON: Ondria Oswald Sanger Yulian Gosney, DO  EBL: none  CONDITION: Stable  COMPLICATIONS: None  INDICATION: The patient, Ann Randall, is a 47 y.o. female born on 02/24/1971, is here for treatment of a dog bite to the left leg with destruction of the soft tissue.Marland Kitchen.   PROCEDURE DETAILS:  The patient was seen prior to surgery and marked.  The IV antibiotics were given. The patient was taken to the operating room and given a general anesthetic. A standard time out was performed and all information was confirmed by those in the room. SCD was placed on the right leg.   The leg was prepped and draped.  The 9 x 12 cm wound was irrigated with saline and antibiotic solution.  It was clean.  No sign of infection at this time.  All of the acell powder and sheet was applied.  It was secured with the 5-0 Vicryl.  The sorbact was applied.  The vac was placed with KY gel.  There was an excellent seal.  A plantar blocking splint was applied. The patient was allowed to wake up and taken to recovery room in stable condition at the end of the case. The family was notified at the end of the case.

## 2018-11-05 NOTE — Transfer of Care (Signed)
Immediate Anesthesia Transfer of Care Note  Patient: Ann Randall  Procedure(s) Performed: IRRIGATION AND DEBRIDEMENT OF LEG WITH A-CELL (Left ) APPLICATION OF WOUND VAC (Left )  Patient Location: PACU  Anesthesia Type:General  Level of Consciousness: awake and alert   Airway & Oxygen Therapy: Patient Spontanous Breathing and Patient connected to nasal cannula oxygen  Post-op Assessment: Report given to RN, Post -op Vital signs reviewed and stable and Patient moving all extremities X 4  Post vital signs: Reviewed and stable  Last Vitals:  Vitals Value Taken Time  BP 141/92 11/05/2018  5:23 PM  Temp    Pulse 83 11/05/2018  5:26 PM  Resp 15 11/05/2018  5:26 PM  SpO2 90 % 11/05/2018  5:26 PM  Vitals shown include unvalidated device data.  Last Pain:  Vitals:   11/05/18 0818  TempSrc:   PainSc: 10-Worst pain ever      Patients Stated Pain Goal: 2 (11/05/18 0818)  Complications: No apparent anesthesia complications

## 2018-11-05 NOTE — Interval H&P Note (Signed)
History and Physical Interval Note:  11/05/2018 4:07 PM  Ann MarshallKimberly Randall  has presented today for surgery, with the diagnosis of dog bite  The various methods of treatment have been discussed with the patient and family. After consideration of risks, benefits and other options for treatment, the patient has consented to  Procedure(s): IRRIGATION AND DEBRIDEMENT OF LEG WITH A-CELL (Left) APPLICATION OF WOUND VAC (Left) as a surgical intervention .  The patient's history has been reviewed, patient examined, no change in status, stable for surgery.  I have reviewed the patient's chart and labs.  Questions were answered to the patient's satisfaction.     Alena Billslaire S Eleaner Dibartolo

## 2018-11-05 NOTE — Anesthesia Procedure Notes (Signed)
Procedure Name: LMA Insertion Date/Time: 11/05/2018 4:37 PM Performed by: Ann LeschesMercer, Lennis Rader L, CRNA Pre-anesthesia Checklist: Patient identified, Emergency Drugs available, Suction available and Patient being monitored Patient Re-evaluated:Patient Re-evaluated prior to induction Oxygen Delivery Method: Circle System Utilized Preoxygenation: Pre-oxygenation with 100% oxygen Induction Type: IV induction Ventilation: Mask ventilation without difficulty LMA: LMA inserted LMA Size: 4.0 Number of attempts: 1 Airway Equipment and Method: Bite block Placement Confirmation: positive ETCO2 Tube secured with: Tape Dental Injury: Teeth and Oropharynx as per pre-operative assessment

## 2018-11-06 ENCOUNTER — Encounter (HOSPITAL_COMMUNITY): Payer: Self-pay | Admitting: Plastic Surgery

## 2018-11-06 LAB — BASIC METABOLIC PANEL
Anion gap: 9 (ref 5–15)
BUN: 22 mg/dL — ABNORMAL HIGH (ref 6–20)
CO2: 29 mmol/L (ref 22–32)
Calcium: 7.2 mg/dL — ABNORMAL LOW (ref 8.9–10.3)
Chloride: 102 mmol/L (ref 98–111)
Creatinine, Ser: 0.72 mg/dL (ref 0.44–1.00)
GFR calc Af Amer: >60 mL/min (ref >60)
GFR calc non Af Amer: >60 mL/min (ref >60)
Glucose, Bld: 150 mg/dL — ABNORMAL HIGH (ref 70–99)
Potassium: 4.4 mmol/L (ref 3.5–5.1)
Sodium: 140 mmol/L (ref 135–145)

## 2018-11-06 LAB — CBC
HCT: 37.5 % (ref 36.0–46.0)
Hemoglobin: 11.6 g/dL — ABNORMAL LOW (ref 12.0–15.0)
MCH: 32.3 pg (ref 26.0–34.0)
MCHC: 30.9 g/dL (ref 30.0–36.0)
MCV: 104.5 fL — ABNORMAL HIGH (ref 80.0–100.0)
Platelets: 149 10*3/uL — ABNORMAL LOW (ref 150–400)
RBC: 3.59 MIL/uL — ABNORMAL LOW (ref 3.87–5.11)
RDW: 12.9 % (ref 11.5–15.5)
WBC: 7.7 10*3/uL (ref 4.0–10.5)
nRBC: 0 % (ref 0.0–0.2)

## 2018-11-06 LAB — T3, FREE: T3, Free: 2.6 pg/mL (ref 2.0–4.4)

## 2018-11-06 MED ORDER — POLYETHYLENE GLYCOL 3350 17 G PO PACK
17.0000 g | PACK | Freq: Two times a day (BID) | ORAL | Status: DC
  Administered 2018-11-06 – 2018-11-07 (×3): 17 g via ORAL
  Filled 2018-11-06 (×3): qty 1

## 2018-11-06 MED ORDER — CALCIUM GLUCONATE-NACL 2-0.675 GM/100ML-% IV SOLN
2.0000 g | Freq: Once | INTRAVENOUS | Status: AC
  Administered 2018-11-06: 2000 mg via INTRAVENOUS
  Filled 2018-11-06: qty 100

## 2018-11-06 MED ORDER — PROMETHAZINE HCL 25 MG/ML IJ SOLN
12.5000 mg | INTRAMUSCULAR | Status: DC | PRN
  Administered 2018-11-06 – 2018-11-07 (×4): 12.5 mg via INTRAVENOUS
  Filled 2018-11-06 (×4): qty 1

## 2018-11-06 MED ORDER — HYDROMORPHONE HCL 1 MG/ML IJ SOLN
0.5000 mg | Freq: Four times a day (QID) | INTRAMUSCULAR | Status: DC | PRN
  Administered 2018-11-06 – 2018-11-07 (×4): 0.5 mg via INTRAVENOUS
  Filled 2018-11-06 (×4): qty 1

## 2018-11-06 MED ORDER — LEVOTHYROXINE SODIUM 100 MCG PO TABS
200.0000 ug | ORAL_TABLET | Freq: Every day | ORAL | Status: DC
  Administered 2018-11-07: 200 ug via ORAL
  Filled 2018-11-06: qty 2
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
## 2018-11-06 NOTE — Progress Notes (Signed)
Physical Therapy Treatment Patient Details Name: Ann Randall MRN: [id number] DOB: 12/24/1970 Today's Date: 11/06/2018    History of Present Illness Pt presents with dog bite to L lower leg. PMH: anemia, gastric bypass, cholecystectomy    PT Comments    Pt was seen for mobility including to BR with greater distances today, especially encouraging with her pain levels not restricting her.  Pt is assisted to elevate LLE after with pain easing while elevated.  Follow acutely for strengthening and to add stairs when ready as pt must ascend them to get home.  Continue as planned.  Follow Up Recommendations  No PT follow up     Equipment Recommendations  Rolling walker with 5" wheels    Recommendations for Other Services       Precautions / Restrictions Precautions Precautions: (pain management, wound vac) Required Braces or Orthoses: Splint/Cast(on L lower leg/ankle) Splint/Cast: hard keel on L ankle Restrictions Weight Bearing Restrictions: No    Mobility  Bed Mobility Overal bed mobility: Modified Independent                Transfers Overall transfer level: Needs assistance Equipment used: Rolling walker (2 wheeled);1 person hand held assist Transfers: Sit to/from Stand Sit to Stand: Min guard         General transfer comment: allowed time to capture initial balance in standing  Ambulation/Gait Ambulation/Gait assistance: Min guard Gait Distance (Feet): 60 Feet Assistive device: Rolling walker (2 wheeled) Gait Pattern/deviations: Step-through pattern;Step-to pattern;Decreased stride length;Decreased weight shift to left;Shuffle         Stairs             Wheelchair Mobility    Modified Rankin (Stroke Patients Only)       Balance Overall balance assessment: Needs assistance Sitting-balance support: Feet supported Sitting balance-Leahy Scale: Good     Standing balance support: Bilateral upper extremity supported;During functional  activity Standing balance-Leahy Scale: Fair                              Cognition Arousal/Alertness: Awake/alert Behavior During Therapy: WFL for tasks assessed/performed Overall Cognitive Status: Within Functional Limits for tasks assessed                                        Exercises      General Comments General comments (skin integrity, edema, etc.): wound vac on LLE       Pertinent Vitals/Pain Pain Assessment: 0-10 Pain Score: 10-Worst pain ever Pain Location: L lower leg Pain Descriptors / Indicators: Operative site guarding Pain Intervention(s): Limited activity within patient's tolerance;Monitored during session;Premedicated before session;Repositioned    Home Living                      Prior Function            PT Goals (current goals can now be found in the care plan section) Acute Rehab PT Goals Patient Stated Goal: get home Progress towards PT goals: Progressing toward goals    Frequency    Min 3X/week      PT Plan Current plan remains appropriate    Co-evaluation              AM-PAC PT "6 Clicks" Mobility   Outcome Measure  Help needed turning from your back to your side while  in a flat bed without using bedrails?: A Little Help needed moving from lying on your back to sitting on the side of a flat bed without using bedrails?: A Little Help needed moving to and from a bed to a chair (including a wheelchair)?: A Little Help needed standing up from a chair using your arms (e.g., wheelchair or bedside chair)?: A Little Help needed to walk in hospital room?: A Little Help needed climbing 3-5 steps with a railing? : A Little 6 Click Score: 18    End of Session Equipment Utilized During Treatment: Gait belt Activity Tolerance: Patient limited by fatigue;Patient limited by pain Patient left: in bed;with call bell/phone within reach;with bed alarm set Nurse Communication: Mobility status PT Visit  Diagnosis: Unsteadiness on feet (R26.81);Pain;Difficulty in walking, not elsewhere classified (R26.2) Pain - Right/Left: Left Pain - part of body: Leg     Time: 5409-8119 PT Time Calculation (min) (ACUTE ONLY): 24 min  Charges:  $Gait Training: 8-22 mins $Therapeutic Activity: 8-22 mins                    Ivar Drape 11/06/2018, 8:27 PM   Samul Dada, PT MS Acute Rehab Dept. Number: [facility] [id number] and [facility] [phone]

## 2018-11-06 NOTE — Progress Notes (Signed)
[facility] Progress Note  1 Day Post-Op  Subjective: CC-  Continues to complain of pain in LLE. Pain medications do help, but she is still requiring some IV dilaudid. She also reports nausea/vomiting when she tries to eat. Denies abdominal pain. No BM since admission. Unsure if the n/v is due to hypocalcemia, anxiety, pain, or constipation.   Objective: Vital signs in last 24 hours: Temp:  [97 F (36.1 C)-98.3 F (36.8 C)] 97.8 F (36.6 C) (12/11 0707) Pulse Rate:  [54-85] 54 (12/11 0707) Resp:  [10-16] 16 (12/11 0707) BP: (89-145)/(52-92) 125/81 (12/11 0707) SpO2:  [89 %-99 %] 99 % (12/11 0707) Last BM Date: 11/02/18  Intake/Output from previous day: 12/10 0701 - 12/11 0700 In: 700 [I.V.:600; IV Piggyback:100] Out: 5 [Blood:5] Intake/Output this shift: No intake/output data recorded.  PE: Gen: Alert, NAD, pleasant HEENT: EOM's intact, pupils equal and round Card: RRR, 2+ DP pulses bilaterally Pulm: CTAB, effort normal Abd: Soft, NT/ND, +BS, no HSM Psych: A&Ox3  Skin: warm and dry LLE: cdi splint to lower leg with wound vac in place, foot WWP  Lab Results:  Recent Labs    11/05/18 0215 11/06/18 0133  WBC 9.3 7.7  HGB 12.3 11.6*  HCT 38.9 37.5  PLT 175 149*   BMET Recent Labs    11/05/18 0215 11/06/18 0133  NA 140 140  K 4.3 4.4  CL 102 102  CO2 27 29  GLUCOSE 125* 150*  BUN 22* 22*  CREATININE 0.98 0.72  CALCIUM 7.0* 7.2*   PT/INR No results for input(s): LABPROT, INR in the last 72 hours. CMP     Component Value Date/Time   NA 140 11/06/2018 0133   K 4.4 11/06/2018 0133   CL 102 11/06/2018 0133   CO2 29 11/06/2018 0133   GLUCOSE 150 (H) 11/06/2018 0133   BUN 22 (H) 11/06/2018 0133   CREATININE 0.72 11/06/2018 0133   CALCIUM 7.2 (L) 11/06/2018 0133   PROT 6.7 11/04/2018 0212   ALBUMIN 3.4 (L) 11/04/2018 0212   AST 41 11/04/2018 0212   ALT 38 11/04/2018 0212   ALKPHOS 74 11/04/2018 0212   BILITOT 0.6 11/04/2018 0212   GFRNONAA >60 11/06/2018 0133   GFRAA >60 11/06/2018 0133   Lipase  No results found for: LIPASE     Studies/Results: Dg Knee Left Port  Result Date: 11/05/2018 CLINICAL DATA:  Lateral left knee pain status post trauma to the mid chin region after a dog bite. EXAM: PORTABLE LEFT KNEE - 1-2 VIEW COMPARISON:  Right tibia and fibula series of today's date FINDINGS: The bones about the knee are subjectively adequately mineralized. There is no acute or healing fracture. There is beaking of the medial tibial spine. Small spurs arise from the articular margins of the patella. There is no joint effusion. IMPRESSION: There is no acute or significant chronic bony abnormality of the left knee. Electronically Signed   By: David  Swazil[place]Jordan M.D.   On: 11/05/2018 08:59   Dg Ankle Left Port  Result Date: 11/05/2018 CLINICAL DATA:  47 year old female status post attacked by dog, dog bites. Left ankle pain. EXAM: PORTABLE LEFT ANKLE - 2 VIEW COMPARISON:  Left tib-fib series 11/03/2018. FINDINGS: Portable views. Partially visible large soft tissue injury along the medial calf as demonstrated on the comparison. Mortise joint alignment preserved. Talar dome intact. No ankle joint effusion. Mild to moderate calcaneus degenerative spurring. No acute osseous abnormality identified. IMPRESSION: 1. No acute osseous abnormality about the left ankle. 2. Large  soft tissue injury at the level of the mid tib fib as seen on 11/03/2018. Electronically Signed   By: Odessa Fleming M.D.   On: 11/05/2018 09:01    Anti-infectives: Anti-infectives (From admission, onward)   Start     Dose/Rate Route Frequency Ordered Stop   11/05/18 1648  polymyxin B 500,000 Units, bacitracin 50,000 Units in sodium chloride 0.9 % 500 mL irrigation  Status:  Discontinued       As needed 11/05/18 1648 11/05/18 1716   11/05/18 0600  ceFAZolin (ANCEF) IVPB 2g/100 mL premix  Status:  Discontinued     2 g 200 mL/hr over 30 Minutes Intravenous On call to O.R.  11/04/18 1619 11/05/18 1815   11/04/18 0230  Ampicillin-Sulbactam (UNASYN) 3 g in sodium chloride 0.9 % 100 mL IVPB    Note to Pharmacy:  Adjust dose as indicated, coverage for dog bite   3 g 200 mL/hr over 30 Minutes Intravenous Every 6 hours 11/03/18 1816     11/03/18 1730  Ampicillin-Sulbactam (UNASYN) 3 g in sodium chloride 0.9 % 100 mL IVPB     3 g 200 mL/hr over 30 Minutes Intravenous  Once 11/03/18 1729 11/03/18 1901       Assessment/Plan Dog bite Complex wound of left lower extremity- s/p debridement with partial closure 12/8 Dr. Fredricka Bonine. s/p acell/VA 12/10 Dr. Ulice Bold Left knee/ankle pain - xrays neg for fx Hypothyroidism- TSH 0.019, synthroid decreased to daily Hypocalcemia - slightly improved 7.2, reorder with IV calcium gluconate 2g Depression - home zoloft H/o gastric bypass Elevated Hcg- per patient this is chronic  ID -unasyn 12/8>> FEN -IVF, reg diet VTE -SCDs, sq heparin Foley -none  Plan: Long discussion with patient regarding weaning IV pain medication. PT/OT today. Replace calcium. Add miralax. Will ask case management for assistance in finding patient a PCP to follow up with after discharge. Will recheck patient this afternoon, stable for d/c once pain controlled on oral medications.   LOS: 1 day    Franne Forts , [facility] 11/06/2018, 7:39 AM Pager: [phone] Mon 7:00 am -11:30 AM Tues-Fri 7:00 am-4:30 pm Sat-Sun 7:00 am-11:30 am

## 2018-11-06 NOTE — Discharge Summary (Signed)
[facility] Discharge Summary   Patient ID: Ann MarshallKimberly Randall MRN: [id number] DOB/AGE: 49/05/1971 49 y.o.  Admit date: 11/03/2018 Discharge date: 11/07/2018  Admitting Diagnosis: Complex avulsion laceration to the left lower extremity following dog bite  Discharge Diagnosis Patient Active Problem List   Diagnosis Date Noted  . Dog bite 11/03/2018    Consultants Plastic surgery  Imaging: Dg Knee Left Port  Result Date: 11/05/2018 CLINICAL DATA:  Lateral left knee pain status post trauma to the mid chin region after a dog bite. EXAM: PORTABLE LEFT KNEE - 1-2 VIEW COMPARISON:  Right tibia and fibula series of today's date FINDINGS: The bones about the knee are subjectively adequately mineralized. There is no acute or healing fracture. There is beaking of the medial tibial spine. Small spurs arise from the articular margins of the patella. There is no joint effusion. IMPRESSION: There is no acute or significant chronic bony abnormality of the left knee. Electronically Signed   By: David  [place]azil[place]Jordan M.D.   On: 11/05/2018 08:59   Dg Ankle Left Port  Result Date: 11/05/2018 CLINICAL DATA:  47 year old female status post attacked by dog, dog bites. Left ankle pain. EXAM: PORTABLE LEFT ANKLE - 2 VIEW COMPARISON:  Left tib-fib series 11/03/2018. FINDINGS: Portable views. Partially visible large soft tissue injury along the medial calf as demonstrated on the comparison. Mortise joint alignment preserved. Talar dome intact. No ankle joint effusion. Mild to moderate calcaneus degenerative spurring. No acute osseous abnormality identified. IMPRESSION: 1. No acute osseous abnormality about the left ankle. 2. Large soft tissue injury at the level of the mid tib fib as seen on 11/03/2018. Electronically Signed   By: Odessa FlemingH  Hall M.D.   On: 11/05/2018 09:01    Procedures Dr. Fredricka Bonineonnor (11/03/18) - debridement of skin, subcutaneous tissue, fascia and muscle 120cm with partial closure of complex degloving  avulsion laceration of the left lower extremity  Dr. Ulice Boldillingham (11/05/18) -  1. Preparation of left leg dog bite wound 9 x 12 cm for 2. Placement of acell (10 x 15 cm sheet and 2 gm) 3. [facility]  Hospital Course:  Ann MarshallKimberly Sanjose is a 47yo female who [facility] suffering a dog bite. States that she was bitten on her left lower extremity by a dog, this is the neighbors dog, on a chain.  Pain is severe persistent and worse with palpation, bleeding controlled. Xray negative for fracture. Patient was admitted and underwent procedure #1 listed above.  Tolerated procedure well and was transferred to the floor.  Plastic surgery was consulted and took the patient back to the operating room 12/10 for procedure #2 listed above. Wound vac was applied and she was placed in a splint, WBAT LLE. She completed 4 days of IV unasyn. Pain control initially difficult but did improve with time and multimodal therapies. Patient worked with therapies during this admission who recommended home with no therapy follow up when medically stable for discharge. On 12/12, the patient was voiding well, tolerating diet, ambulating well, pain control improving, vital signs stable and felt stable for discharge home.  Patient will follow up as below and knows to call with questions or concerns.    I have personally reviewed the patients medication history on the [place] controlled substance database.     Allergies as of 11/07/2018   No Known Allergies     Medication List    TAKE these medications   acetaminophen 500 MG tablet Commonly known as:  TYLENOL Take 2  tablets (1,000 mg total) by mouth every 8 (eight) hours as needed.   calcitRIOL 0.5 MCG capsule Commonly known as:  ROCALTROL Take 1 mcg by mouth 2 (two) times daily.   CALCIUM 1200+D3 PO Take 4 tablets by mouth 2 (two) times daily.   docusate sodium 100 MG capsule Commonly known as:  COLACE Take 1 capsule (100 mg total) by mouth 2 (two)  times daily.   gabapentin 300 MG capsule Commonly known as:  NEURONTIN Take 1 capsule (300 mg total) by mouth 3 (three) times daily.   levothyroxine 100 MCG tablet Commonly known as:  SYNTHROID, LEVOTHROID Take 150 mcg by mouth daily before breakfast.   magnesium oxide 400 MG tablet Commonly known as:  MAG-OX Take 400 mg by mouth every morning.   methocarbamol 500 MG tablet Commonly known as:  ROBAXIN Take 2 tablets (1,000 mg total) by mouth every 8 (eight) hours as needed for muscle spasms.   naproxen sodium 220 MG tablet Commonly known as:  ALEVE Take 220-660 mg by mouth daily as needed (pain).   ondansetron 4 MG disintegrating tablet Commonly known as:  ZOFRAN-ODT Take 1 tablet (4 mg total) by mouth every 6 (six) hours as needed for nausea or vomiting.   Oxycodone HCl 10 MG Tabs Take 1-1.5 tablets (10-15 mg total) by mouth every 6 (six) hours as needed for moderate pain or severe pain (10mg  moderate, 15mg  severe).   polyethylene glycol packet Commonly known as:  MIRALAX / GLYCOLAX Take 17 g by mouth daily.   sertraline 50 MG tablet Commonly known as:  ZOLOFT Take 50 mg by mouth 2 (two) times daily.   topiramate 100 MG tablet Commonly known as:  TOPAMAX Take 100 mg by mouth at bedtime as needed (migraine).   Vitamin D (Ergocalciferol) 1.25 MG (50000 UT) Caps capsule Commonly known as:  DRISDOL Take 50,000 Units by mouth every Sunday.            Durable Medical Equipment  (From admission, onward)         Start     Ordered   11/05/18 0727  For home use only DME Walker rolling  Once    Question:  Patient needs a walker to treat with the following condition  Answer:  Dog bite of lower extremity   11/05/18 0726           Follow-up Information    [facility] Follow up.   Why:  November 14, 2018 at 0950 am  Contact information: [address] [place] [address] [phone]       [facility] - Dme Follow up.   Why:  Provide home health RN Contact information: [address] [place] [address] [phone]        Peggye Form, DO. Schedule an appointment as soon as possible for a visit in 1 week(s).   Specialty:  Plastic Surgery Why:  call to make appointment Contact information: [address] [place] [address] [phone]        [facility]. Call.   Why:  as needed, you do not have to schedule an appointment Contact information: [address] [place] [address] [phone]          Signed: Franne Forts, [facility] 11/06/2018, 1:37 PM Pager: [phone] Mon 7:00 am -11:30 AM Tues-Fri 7:00 am-4:30 pm Sat-Sun 7:00 am-11:30 am

## 2018-11-06 NOTE — Progress Notes (Addendum)
Subjective: Very pleasant white 47 year old female pt is one day s/p placement of acell on left lower extremity and VAC placement.  Pt was unfortunately a victim of a dog attack this past weekend.  Pt reports a high level of pain.  She notes some benefit to pain medication.  Pt has also been experiencing nausea.   Objective: Vital signs in last 24 hours: Temp:  [97 F (36.1 C)-98.3 F (36.8 C)] 97.9 F (36.6 C) (12/11 1037) Pulse Rate:  [54-85] 55 (12/11 1037) Resp:  [10-17] 17 (12/11 1037) BP: (89-145)/(52-92) 98/74 (12/11 1037) SpO2:  [89 %-99 %] 96 % (12/11 1037) Weight change:  Last BM Date: 11/02/18  Intake/Output from previous day: 12/10 0701 - 12/11 0700 In: 700 [I.V.:600; IV Piggyback:100] Out: 5 [Blood:5] Intake/Output this shift: Total I/O In: 300 [P.O.:300] Out: -   Pt is alert and oriented x 3 Pt left lower extremity - wound vac in place, Ace wrap in place, pt has good sensation and movement of the toes   Lab Results: Recent Labs    11/05/18 0215 11/06/18 0133  WBC 9.3 7.7  HGB 12.3 11.6*  HCT 38.9 37.5  PLT 175 149*   BMET Recent Labs    11/05/18 0215 11/06/18 0133  NA 140 140  K 4.3 4.4  CL 102 102  CO2 27 29  GLUCOSE 125* 150*  BUN 22* 22*  CREATININE 0.98 0.72  CALCIUM 7.0* 7.2*    Studies/Results: Dg Knee Left Port  Result Date: 11/05/2018 CLINICAL DATA:  Lateral left knee pain status post trauma to the mid chin region after a dog bite. EXAM: PORTABLE LEFT KNEE - 1-2 VIEW COMPARISON:  Right tibia and fibula series of today's date FINDINGS: The bones about the knee are subjectively adequately mineralized. There is no acute or healing fracture. There is beaking of the medial tibial spine. Small spurs arise from the articular margins of the patella. There is no joint effusion. IMPRESSION: There is no acute or significant chronic bony abnormality of the left knee. Electronically Signed   By: David  Swazil[place]Jordan M.D.   On: 11/05/2018 08:59   Dg Ankle  Left Port  Result Date: 11/05/2018 CLINICAL DATA:  47 year old female status post attacked by dog, dog bites. Left ankle pain. EXAM: PORTABLE LEFT ANKLE - 2 VIEW COMPARISON:  Left tib-fib series 11/03/2018. FINDINGS: Portable views. Partially visible large soft tissue injury along the medial calf as demonstrated on the comparison. Mortise joint alignment preserved. Talar dome intact. No ankle joint effusion. Mild to moderate calcaneus degenerative spurring. No acute osseous abnormality identified. IMPRESSION: 1. No acute osseous abnormality about the left ankle. 2. Large soft tissue injury at the level of the mid tib fib as seen on 11/03/2018. Electronically Signed   By: Odessa FlemingH  Hall M.D.   On: 11/05/2018 09:01    Medications: I have reviewed the patient's current medications.  Assessment/Plan: Advised the pt to continue good nutrition including a protein shake Continue management of pain We will continue to monitor wound  LOS: 1 day    Everlean CherryCarmen C Mayo, [facility] [phone] 11/06/2018

## 2018-11-07 LAB — CBC
HCT: 37.7 % (ref 36.0–46.0)
Hemoglobin: 11.7 g/dL — ABNORMAL LOW (ref 12.0–15.0)
MCH: 32.8 pg (ref 26.0–34.0)
MCHC: 31 g/dL (ref 30.0–36.0)
MCV: 105.6 fL — ABNORMAL HIGH (ref 80.0–100.0)
Platelets: 146 10*3/uL — ABNORMAL LOW (ref 150–400)
RBC: 3.57 MIL/uL — ABNORMAL LOW (ref 3.87–5.11)
RDW: 13.1 % (ref 11.5–15.5)
WBC: 7.6 10*3/uL (ref 4.0–10.5)
nRBC: 0 % (ref 0.0–0.2)

## 2018-11-07 LAB — BASIC METABOLIC PANEL
Anion gap: 10 (ref 5–15)
BUN: 16 mg/dL (ref 6–20)
CHLORIDE: 106 mmol/L (ref 98–111)
CO2: 25 mmol/L (ref 22–32)
Calcium: 7.6 mg/dL — ABNORMAL LOW (ref 8.9–10.3)
Creatinine, Ser: 0.75 mg/dL (ref 0.44–1.00)
GFR calc Af Amer: >60 mL/min (ref >60)
GFR calc non Af Amer: >60 mL/min (ref >60)
Glucose, Bld: 100 mg/dL — ABNORMAL HIGH (ref 70–99)
Potassium: 4.1 mmol/L (ref 3.5–5.1)
Sodium: 141 mmol/L (ref 135–145)

## 2018-11-07 MED ORDER — METHOCARBAMOL 500 MG PO TABS
1000.0000 mg | ORAL_TABLET | Freq: Three times a day (TID) | ORAL | Status: DC
  Administered 2018-11-07 (×2): 1000 mg via ORAL
  Filled 2018-11-07 (×2): qty 2

## 2018-11-07 MED ORDER — OXYCODONE HCL 10 MG PO TABS: 10.0000 mg | ORAL_TABLET | Freq: Four times a day (QID) | ORAL | 0 refills | Status: DC | PRN

## 2018-11-07 MED ORDER — CALCIUM GLUCONATE-NACL 2-0.675 GM/100ML-% IV SOLN: 2.0000 g | Freq: Once | INTRAVENOUS | Status: DC

## 2018-11-07 MED ORDER — METHOCARBAMOL 500 MG PO TABS: 1000.0000 mg | ORAL_TABLET | Freq: Three times a day (TID) | ORAL | 0 refills | Status: DC | PRN

## 2018-11-07 MED ORDER — ACETAMINOPHEN 500 MG PO TABS: 1000.0000 mg | ORAL_TABLET | Freq: Three times a day (TID) | ORAL | 0 refills | Status: AC | PRN

## 2018-11-07 MED ORDER — CALCIUM GLUCONATE-NACL 2-0.675 GM/100ML-% IV SOLN
2.0000 g | Freq: Once | INTRAVENOUS | Status: AC
  Administered 2018-11-07: 2000 mg via INTRAVENOUS
  Filled 2018-11-07: qty 100

## 2018-11-07 MED ORDER — DOCUSATE SODIUM 100 MG PO CAPS: 100.0000 mg | ORAL_CAPSULE | Freq: Two times a day (BID) | ORAL | 0 refills | Status: DC

## 2018-11-07 MED ORDER — ONDANSETRON 4 MG PO TBDP: 4.0000 mg | ORAL_TABLET | Freq: Four times a day (QID) | ORAL | 0 refills | Status: DC | PRN

## 2018-11-07 MED ORDER — GABAPENTIN 300 MG PO CAPS: 300.0000 mg | ORAL_CAPSULE | Freq: Three times a day (TID) | ORAL | 0 refills | Status: DC

## 2018-11-07 MED ORDER — POLYETHYLENE GLYCOL 3350 17 G PO PACK: 17.0000 g | PACK | Freq: Every day | ORAL | 0 refills | Status: DC

## 2018-11-07 MED FILL — ACETAMINOPHEN EXTRA STRENGT: 500 | 5 days supply | Qty: 30 | Fill #0

## 2018-11-07 MED FILL — POLYETHYLENE GLYCOL 3350 PO: 14 days supply | Qty: 238 | Fill #0

## 2018-11-07 MED FILL — ONDANSETRON ODT 4 MG TABLET: 4 | 5 days supply | Qty: 20 | Fill #0

## 2018-11-07 MED FILL — GABAPENTIN 300 MG CAPSULE: 300 | 20 days supply | Qty: 60 | Fill #0

## 2018-11-07 MED FILL — METHOCARBAMOL 500 MG TABLET: 500 | 5 days supply | Qty: 30 | Fill #0

## 2018-11-07 MED FILL — oxyCODONE HCL 10 MG TABS: 10 | 5 days supply | Qty: 30 | Fill #0

## 2018-11-07 NOTE — Progress Notes (Signed)
Physical Therapy Treatment Patient Details Name: Ann Randall MRN: [id number] DOB: 02/07/1971 Today's Date: 11/07/2018    History of Present Illness Pt presents with dog bite to L lower leg. Pt is s/p I and D of LLE wound and placement of wound vac. PMH: anemia, gastric bypass, cholecystectomy    PT Comments    Pt progressing towards goals. Session focused on practicing stair navigation. Pt with very limited tolerance for gait, so used recliner to wheel pt down to steps. Required min guard A and use of RW for stair navigation. Educated about how pt's sister should guard RW during stair navigation and gave handout. Pt had increased dizziness upon finishing stair navigation and required seated rest. Symptoms improved with seated rest. Will continue to follow acutely to maximize functional mobility independence and safety.    Follow Up Recommendations  No PT follow up     Equipment Recommendations  Rolling walker with 5" wheels    Recommendations for Other Services       Precautions / Restrictions Precautions Precautions: Other (comment) Precaution Comments: wound vac  Required Braces or Orthoses: Splint/Cast Splint/Cast: hard keel on L ankle Restrictions Weight Bearing Restrictions: No    Mobility  Bed Mobility Overal bed mobility: Modified Independent             General bed mobility comments: Use of UEs to help with moving LLE back onto bed.   Transfers Overall transfer level: Needs assistance Equipment used: Rolling walker (2 wheeled);1 person hand held assist Transfers: Sit to/from [employer] CorporationStand;Stand Pivot Transfers Sit to Stand: Min guard Stand pivot transfers: Min guard       General transfer comment: Min guard for safety throughout. Demonstrated safe hand placement. Increased time required to perform secondary to pain. Performed stand pivot back to bed following stair training.   Ambulation/Gait Ambulation/Gait assistance: Min guard Gait Distance (Feet): 5  Feet Assistive device: Rolling walker (2 wheeled) Gait Pattern/deviations: Step-through pattern;Step-to pattern;Decreased stride length;Decreased weight shift to left;Shuffle Gait velocity: Decreased    General Gait Details: Pt ambulated from bed to chair this session and wheeled pt down to stairwell to practice steps. Further gait training limited secondary to pain.    Stairs Stairs: Yes Stairs assistance: Min guard Stair Management: No rails;Step to pattern;Backwards;Forwards;With walker Number of Stairs: 4 General stair comments: Practiced stair navigation with use of RW and gave handout with instructions. Required min guard for safety and cues for LE sequencing. Educated and demonstrated how sister should guard RW during Dentiststair management. Following stair management, pt with increased sway and reporting increased dizziness, so returned to sitting. Symptoms improved with seated rest.    Wheelchair Mobility    Modified Rankin (Stroke Patients Only)       Balance Overall balance assessment: Needs assistance Sitting-balance support: Feet supported Sitting balance-Leahy Scale: Good     Standing balance support: Bilateral upper extremity supported;During functional activity Standing balance-Leahy Scale: Poor Standing balance comment: Reliant on BUE support                             Cognition Arousal/Alertness: Awake/alert Behavior During Therapy: WFL for tasks assessed/performed Overall Cognitive Status: Within Functional Limits for tasks assessed                                        Exercises      General  Comments        Pertinent Vitals/Pain Pain Assessment: 0-10 Pain Score: 10-Worst pain ever Pain Location: L lower leg Pain Descriptors / Indicators: Operative site guarding Pain Intervention(s): Limited activity within patient's tolerance;Monitored during session;Repositioned    Home Living                      Prior  Function            PT Goals (current goals can now be found in the care plan section) Acute Rehab PT Goals Patient Stated Goal: get home PT Goal Formulation: With patient Time For Goal Achievement: 11/18/18 Potential to Achieve Goals: Good Progress towards PT goals: Progressing toward goals    Frequency    Min 3X/week      PT Plan Current plan remains appropriate    Co-evaluation              AM-PAC PT "6 Clicks" Mobility   Outcome Measure  Help needed turning from your back to your side while in a flat bed without using bedrails?: None Help needed moving from lying on your back to sitting on the side of a flat bed without using bedrails?: None Help needed moving to and from a bed to a chair (including a wheelchair)?: A Little Help needed standing up from a chair using your arms (e.g., wheelchair or bedside chair)?: A Little Help needed to walk in hospital room?: A Little Help needed climbing 3-5 steps with a railing? : A Little 6 Click Score: 20    End of Session Equipment Utilized During Treatment: Gait belt Activity Tolerance: Patient limited by pain Patient left: in bed;with call bell/phone within reach Nurse Communication: Mobility status PT Visit Diagnosis: Unsteadiness on feet (R26.81);Pain;Difficulty in walking, not elsewhere classified (R26.2) Pain - Right/Left: Left Pain - part of body: Leg     Time: 1538-1610 PT Time Calculation (min) (ACUTE ONLY): 32 min  Charges:  $Gait Training: 8-22 mins $Therapeutic Activity: 8-22 mins                     Gladys Damme, PT, DPT  Acute Rehabilitation Services  Pager: [phone] Office: [phone]    Lehman Prom 11/07/2018, 4:33 PM

## 2018-11-07 NOTE — Progress Notes (Signed)
SBIRT completed.  No referral needed.  Pt denies any problems with ETOH use.   Quintella BatonJulie W. Liv Rallis, RN, BSN  Trauma/Neuro ICU Case Manager [phone]

## 2018-11-07 NOTE — Care Management Note (Signed)
Case Management Note Original note by: Kingsley PlanWile, Heather Marie, RN 11/04/2018, 11:02 AM  Patient Details  Name: Chanetta MarshallKimberly Lunden MRN: [id number] Date of Birth: 10/15/1971  Subjective/Objective:                    Action/Plan:  Discussed discharge planning with patient at bedside. Patient states she just moved her with her sister. Patient confirmed she she no insurance.   Previous PCP is DR Rose Fillersebecca Fairchild at [facility], [address], [place], [place], [id number] phone [phone].  Patient's address is : [address], [place], [place] [address].  Patient's cell [phone]  Sister's cell [name] [phone].  Sister [name] currently in room with patient.   [facility] started. Paged Dr Ulice Boldillingham for signature.  [employer] VAC form placed on chart with charity application.   Ref[facility] for Home health RN [facility].  [facility] at 0950 am.     Patient and sister voiced understanding and agreement to all of above.   Sp[facility] with [facility] PA she has completed VAC application and faxed it to EffieDawn at [facility].   NCM completed Application for [facility] and faxed it to [place] at [facility].   Expected Discharge Date:  11/07/18               Expected Discharge Plan:  Home w Home Health Services  In-House Referral:     Discharge planning Services  CM Consult, MATCH Program, Indigent Health Clinic  Post Acute Care Choice:  Durable Medical Equipment, Home Health Choice offered to:  Patient  DME Arranged:  Gwenith DailyVac, Walker rolling DME Agency:  [facility], [facility]  HH Arranged:  [facility] Agency:  [facility]  Status of Service:  Completed, signed off  If discussed at Long Length of Stay Meetings, dates discussed:    Additional Comments:  11/07/18 J. Adan Baehr, RN, BSN Pt medically stable for dc home today with sister.  Home wound VAC at bedside; pt qualified for [facility]  assistance through [facility] charity program.  [facility] and override of copays, as pt states she has no money.  DC meds to be delivered to bedside.  [facility]W, to be delivered to room prior to dc home.    Quintella BatonJulie W. Renezmae Canlas, RN, BSN  Trauma/Neuro ICU Case Manager [phone]

## 2018-11-07 NOTE — Progress Notes (Signed)
[facility] Progress Note  2 Days Post-Op  Subjective: CC-  Sitting up in bed. States that she was already up walking a lot this morning. Continues to have a lot of pain in the LLE. Down to only 2 doses IV dilaudid yesterday. States that PT is supposed to see her again today so she can work on stairs. Denies any abdominal pain but states that she did have n/v x2 last night. Passing flatus and tolerating diet. No BM. Thinks that her n/v is due to pain.  Objective: Vital signs in last 24 hours: Temp:  [97.7 F (36.5 C)-98.1 F (36.7 C)] 97.8 F (36.6 C) (12/12 0613) Pulse Rate:  [52-70] 58 (12/12 0613) Resp:  [17-19] 18 (12/12 0613) BP: (91-108)/(57-76) 91/57 (12/12 0613) SpO2:  [95 %-98 %] 95 % (12/12 0613) Last BM Date: 11/02/18  Intake/Output from previous day: 12/11 0701 - 12/12 0700 In: 4777.2 [P.O.:1117; I.V.:3360.3; IV Piggyback:299.9] Out: 0  Intake/Output this shift: No intake/output data recorded.  PE: Gen: Alert, NAD, pleasant HEENT: EOM's intact, pupils equal and round Card: RRR, 2+ DP pulses bilaterally Pulm:CTAB, effort normal Abd: Soft, NT/ND, +BS, no HSM Psych: A&Ox3  Skin: warm and dry LLE:cdi splint to lower leg with wound vac in place, foot [facility]   Lab Results:  Recent Labs    11/06/18 0133 11/07/18 0218  WBC 7.7 7.6  HGB 11.6* 11.7*  HCT 37.5 37.7  PLT 149* 146*   BMET Recent Labs    11/06/18 0133 11/07/18 0218  NA 140 141  K 4.4 4.1  CL 102 106  CO2 29 25  GLUCOSE 150* 100*  BUN 22* 16  CREATININE 0.72 0.75  CALCIUM 7.2* 7.6*   PT/INR No results for input(s): LABPROT, INR in the last 72 hours. CMP     Component Value Date/Time   NA 141 11/07/2018 0218   K 4.1 11/07/2018 0218   CL 106 11/07/2018 0218   CO2 25 11/07/2018 0218   GLUCOSE 100 (H) 11/07/2018 0218   BUN 16 11/07/2018 0218   CREATININE 0.75 11/07/2018 0218   CALCIUM 7.6 (L) 11/07/2018 0218   PROT 6.7 11/04/2018 0212   ALBUMIN 3.4 (L) 11/04/2018  0212   AST 41 11/04/2018 0212   ALT 38 11/04/2018 0212   ALKPHOS 74 11/04/2018 0212   BILITOT 0.6 11/04/2018 0212   GFRNONAA >60 11/07/2018 0218   GFRAA >60 11/07/2018 0218   Lipase  No results found for: LIPASE     Studies/Results: No results found.  Anti-infectives: Anti-infectives (From admission, onward)   Start     Dose/Rate Route Frequency Ordered Stop   11/05/18 1648  polymyxin B 500,000 Units, bacitracin 50,000 Units in sodium chloride 0.9 % 500 mL irrigation  Status:  Discontinued       As needed 11/05/18 1648 11/05/18 1716   11/05/18 0600  ceFAZolin (ANCEF) IVPB 2g/100 mL premix  Status:  Discontinued     2 g 200 mL/hr over 30 Minutes Intravenous On call to O.R. 11/04/18 1619 11/05/18 1815   11/04/18 0230  Ampicillin-Sulbactam (UNASYN) 3 g in sodium chloride 0.9 % 100 mL IVPB    Note to Pharmacy:  Adjust dose as indicated, coverage for dog bite   3 g 200 mL/hr over 30 Minutes Intravenous Every 6 hours 11/03/18 1816 11/06/18 2129   11/03/18 1730  Ampicillin-Sulbactam (UNASYN) 3 g in sodium chloride 0.9 % 100 mL IVPB     3 g 200 mL/hr over 30 Minutes Intravenous  Once  11/03/18 1729 11/03/18 1901       Assessment/Plan Dog bite Complex wound of left lower extremity- s/p debridement with partial closure 12/8 Dr. Fredricka Bonineonnor.s/p acell/VAC 12/10 Dr. Ulice Boldillingham Left knee/ankle pain - xrays neg for fx Hypothyroidism- TSH 0.019, synthroid decreased to 200mcg daily Hypocalcemia - slowly improving 7.6, reorder with IV calcium gluconate 2g Depression - home zoloft H/o gastric bypass Elevated Hcg- per patient this is chronic  ID -unasyn 12/8>> FEN -IVF,reg diet VTE -SCDs, sq heparin Foley -none  Plan: PT/OT to work on stairs. Continue weaning IV dilaudid. Increase robaxin to 1000mg . Replace calcium as above.  Will recheck patient this afternoon for possible discharge.   LOS: 2 days    Franne FortsBrooke A Lakasha Mcfall , [facility] 11/07/2018, 8:53  AM Pager: [phone] Mon 7:00 am -11:30 AM Tues-Fri 7:00 am-4:30 pm Sat-Sun 7:00 am-11:30 am

## 2018-11-09 NOTE — Progress Notes (Signed)
Pt called back to the hospital on 12/14 at 1138 and I spoke with her about home care.  She stated that she had left her DC instructions at the hospital.  I reviewed them with her and gave her #s for [facility] to call, Dr. Gus Heightillinghams # to call for follow up appointment, and the appt time and date for her app[facility].  She was having some issues with her VAC and I told her to call [facility] about it.

## 2018-11-13 NOTE — Progress Notes (Deleted)
Patient ID: Ann Randall, female   DOB: 06/27/1971, 47 y.o.   MRN: [id number]  After hospitalization 12/08-12/10/2018 for dog bit and debridement.     From discharge summary:  Procedures Dr. Fredricka Bonineonnor (11/03/18) - debridement of skin, subcutaneous tissue, fascia and muscle 120cm with partial closure of complex degloving avulsion laceration of the left lower extremity  Dr. Ulice Boldillingham (11/05/18) -  1.Preparation ofleft leg dog bite wound 9x 12cm for 2. Placement of acell (10x 15cm sheet and 2gm) 3. Placement of [facility]  Hospital Course:  Ann Randall is a 47yo female [facility] suffering a [facility] bite. States that she was bitten on her left lower extremity by a dog, this is the neighbors dog, on a chain.  Pain is severe persistent and worse with palpation, bleeding controlled. Xray negative for fracture. Patient was admitted and underwent procedure #1 listed above.  Tolerated procedure well and was transferred to the floor.  Plastic surgery was consulted and took the patient back to the operating room 12/10 for procedure #2 listed above. Wound vac was applied and she was placed in a splint, WBAT LLE. She completed 4 days of IV unasyn. Pain control initially difficult but did improve with time and multimodal therapies. Patient worked with therapies during this admission who recommended home with no therapy follow up when medically stable for discharge. On 12/12, the patient was voiding well, tolerating diet, ambulating well, pain control improving, vital signs stable and felt stable for discharge home.  Patient will follow up as below and knows to call with questions or concerns.

## 2018-11-14 ENCOUNTER — Other Ambulatory Visit: Payer: Self-pay

## 2018-11-14 ENCOUNTER — Encounter (HOSPITAL_COMMUNITY): Payer: Self-pay

## 2018-11-14 ENCOUNTER — Emergency Department (HOSPITAL_COMMUNITY): Payer: Self-pay

## 2018-11-14 ENCOUNTER — Inpatient Hospital Stay: Payer: Self-pay

## 2018-11-14 ENCOUNTER — Emergency Department (HOSPITAL_COMMUNITY)
Admission: EM | Admit: 2018-11-14 | Discharge: 2018-11-14 | Disposition: A | Discharge status: Home / Self Care | Payer: Self-pay | Attending: Emergency Medicine | Admitting: Emergency Medicine

## 2018-11-14 DIAGNOSIS — R1084 Generalized abdominal pain: Secondary | ICD-10-CM | POA: Insufficient documentation

## 2018-11-14 DIAGNOSIS — Z9889 Other specified postprocedural states: Secondary | ICD-10-CM | POA: Insufficient documentation

## 2018-11-14 DIAGNOSIS — M79605 Pain in left leg: Secondary | ICD-10-CM

## 2018-11-14 DIAGNOSIS — M79662 Pain in left lower leg: Secondary | ICD-10-CM | POA: Insufficient documentation

## 2018-11-14 DIAGNOSIS — R197 Diarrhea, unspecified: Secondary | ICD-10-CM | POA: Insufficient documentation

## 2018-11-14 LAB — BASIC METABOLIC PANEL
Anion gap: 10 (ref 5–15)
BUN: 11 mg/dL (ref 6–20)
CHLORIDE: 107 mmol/L (ref 98–111)
CO2: 25 mmol/L (ref 22–32)
Calcium: 7.5 mg/dL — ABNORMAL LOW (ref 8.9–10.3)
Creatinine, Ser: 0.8 mg/dL (ref 0.44–1.00)
GFR calc Af Amer: >60 mL/min (ref >60)
GFR calc non Af Amer: >60 mL/min (ref >60)
Glucose, Bld: 117 mg/dL — ABNORMAL HIGH (ref 70–99)
POTASSIUM: 3.8 mmol/L (ref 3.5–5.1)
SODIUM: 142 mmol/L (ref 135–145)

## 2018-11-14 LAB — I-STAT CG4 LACTIC ACID, ED: Lactic Acid, Venous: 1.39 mmol/L (ref 0.5–1.9)

## 2018-11-14 LAB — CBC
HCT: 43.9 % (ref 36.0–46.0)
HEMOGLOBIN: 14.5 g/dL (ref 12.0–15.0)
MCH: 33.4 pg (ref 26.0–34.0)
MCHC: 33 g/dL (ref 30.0–36.0)
MCV: 101.2 fL — ABNORMAL HIGH (ref 80.0–100.0)
Platelets: 326 10*3/uL (ref 150–400)
RBC: 4.34 MIL/uL (ref 3.87–5.11)
RDW: 13.3 % (ref 11.5–15.5)
WBC: 13.4 10*3/uL — ABNORMAL HIGH (ref 4.0–10.5)
nRBC: 0 % (ref 0.0–0.2)

## 2018-11-14 LAB — HEPATIC FUNCTION PANEL
ALK PHOS: 82 U/L (ref 38–126)
ALT: 20 U/L (ref 0–44)
AST: 26 U/L (ref 15–41)
Albumin: 3.3 g/dL — ABNORMAL LOW (ref 3.5–5.0)
Bilirubin, Direct: 0.2 mg/dL (ref 0.0–0.2)
Indirect Bilirubin: 0.4 mg/dL (ref 0.3–0.9)
TOTAL PROTEIN: 7.1 g/dL (ref 6.5–8.1)
Total Bilirubin: 0.6 mg/dL (ref 0.3–1.2)

## 2018-11-14 LAB — LIPASE, BLOOD: Lipase: 31 U/L (ref 11–51)

## 2018-11-14 MED ORDER — ENOXAPARIN SODIUM 100 MG/ML ~~LOC~~ SOLN
100.0000 mg | Freq: Once | SUBCUTANEOUS | Status: AC
  Administered 2018-11-14: 100 mg via SUBCUTANEOUS
  Filled 2018-11-14: qty 1

## 2018-11-14 MED ORDER — SODIUM CHLORIDE 0.9 % IV BOLUS
500.0000 mL | Freq: Once | INTRAVENOUS | Status: AC
  Administered 2018-11-14: 500 mL via INTRAVENOUS

## 2018-11-14 MED ORDER — MORPHINE SULFATE (PF) 4 MG/ML IV SOLN
4.0000 mg | Freq: Once | INTRAVENOUS | Status: AC
  Administered 2018-11-14: 4 mg via INTRAVENOUS
  Filled 2018-11-14: qty 1

## 2018-11-14 MED ORDER — HYDROCODONE-ACETAMINOPHEN 5-325 MG PO TABS: 2.0000 | ORAL_TABLET | Freq: Four times a day (QID) | ORAL | 0 refills | Status: AC | PRN

## 2018-11-14 NOTE — Discharge Instructions (Signed)
IMPORTANT PATIENT INSTRUCTIONS:  You have been scheduled for an Outpatient Vascular Study at [facility].   ° °If tomorrow is a Saturday, Sunday or holiday, please go to the [facility] Registration Desk at 8 am tomorrow morning and tell them you are there for a vascular study.  If tomorrow is a weekday (Monday-Friday), please go to [facility] Admitting Department at 8 am and tell them  °you are  °there for a vascular study. ° °

## 2018-11-14 NOTE — ED Triage Notes (Signed)
Pt here with left leg, had recent surgery on the leg.  Wound vac still in place.  No drainage from around the vac or other wounds.  A&Ox4.

## 2018-11-14 NOTE — ED Provider Notes (Signed)
Emergency Department Provider Note   I have reviewed the triage vital signs and the nursing notes.   HISTORY  Chief Complaint Leg Pain and Post-op Problem   HPI Ann Randall is a 47 y.o. female with PMH of anemia and recent complex avulsion laceration of the left leg requiring wound vac presents to the emergency department for evaluation of worsening left leg pain and swelling.  She reports an electrical type shooting pain down the entire leg worse in the lower leg.  No injury.  No numbness or tingling.  Patient states she is been feeling hot/cold at home but no recorded fever. No drainage from the wound.  The wound VAC is still in place. No chest pain or sob. Patient has developed a mucous like diarrhea that is foul-smelling with abdominal cramping pain as well.  Past Medical History:  Diagnosis Date  . Anemia     Patient Active Problem List   Diagnosis Date Noted  . Dog bite 11/03/2018    Past Surgical History:  Procedure Laterality Date  . ABDOMINAL HYSTERECTOMY    . APPLICATION OF WOUND VAC Left 11/05/2018   Procedure: APPLICATION OF WOUND VAC;  Surgeon: Peggye Form, DO;  Location: [facility] OR;  Service: Plastics;  Laterality: Left;  . CHOLECYSTECTOMY    . GASTRIC BYPASS    . I&D EXTREMITY Left 11/03/2018   Procedure: DEBRIDEMENT OF LEFT LOWER EXTREMITY WOUND;  Surgeon: Berna Bue, MD;  Location[facility] OR;  Service: General;  Laterality: Left;  . I&D EXTREMITY Left 11/05/2018   Procedure: IRRIGATION AND DEBRIDEMENT OF LEG WITH A-CELL;  Surgeon: Peggye Form, DO;  Location: [facility] OR;  Service: Plastics;  Laterality: Left;  . TUBAL LIGATION     Allergies Patient has no known allergies.  History reviewed. No pertinent family history.  Social History Social History   Tobacco Use  . Smoking status: Never Smoker  . Smokeless tobacco: Never Used  Substance Use Topics  . Alcohol use: Yes    Comment: Wine  . Drug use: Never    Review of  Systems  Constitutional: No fever/chills Eyes: No visual changes. ENT: No sore throat. Cardiovascular: Denies chest pain. Respiratory: Denies shortness of breath. Gastrointestinal: No abdominal pain.  No nausea, no vomiting.  No diarrhea.  No constipation. Genitourinary: Negative for dysuria. Musculoskeletal: Positive left leg pain and swelling.  Skin: Negative for rash. Neurological: Negative for headaches, focal weakness or numbness.  10-point ROS otherwise negative.  ____________________________________________   PHYSICAL EXAM:  VITAL SIGNS: ED Triage Vitals [11/14/18 2029]  Enc Vitals Group     BP (!) 131/94     Pulse Rate 78     Resp 19     Temp 98.2 F (36.8 C)     Temp Source Oral     SpO2 100 %     Pain Score 5   Constitutional: Alert and oriented. Well appearing and in no acute distress. Eyes: Conjunctivae are normal. Head: Atraumatic. Nose: No congestion/rhinnorhea. Mouth/Throat: Mucous membranes are moist.  Neck: No stridor.   Cardiovascular: Normal rate, regular rhythm. Good peripheral circulation. Grossly normal heart sounds.   Respiratory: Normal respiratory effort.  No retractions. Lungs CTAB. Gastrointestinal: Soft and nontender. No distention.  Musculoskeletal: Mild left leg tenderness and edema. No gross deformities of extremities. Neurologic:  Normal speech and language.  Skin:  Skin is warm and dry. No cellulitis. Wound vac in place and draining.      ____________________________________________   LABS (all labs  ordered are listed, but only abnormal results are displayed)  Labs Reviewed  CBC - Abnormal; Notable for the following components:      Result Value   WBC 13.4 (*)    MCV 101.2 (*)    All other components within normal limits  BASIC METABOLIC PANEL - Abnormal; Notable for the following components:   Glucose, Bld 117 (*)    Calcium 7.5 (*)    All other components within normal limits  HEPATIC FUNCTION PANEL - Abnormal; Notable  for the following components:   Albumin 3.3 (*)    All other components within normal limits  LIPASE, BLOOD  I-STAT CG4 LACTIC ACID, ED  I-STAT CG4 LACTIC ACID, ED   ____________________________________________  RADIOLOGY  Dg Tibia/fibula Left  Result Date: 11/14/2018 CLINICAL DATA:  Severe left lower leg pain and fevers. Recent dog bite status post debridement and wound VAC placement. EXAM: LEFT TIBIA AND FIBULA - 2 VIEW COMPARISON:  Left tibia fibula x-rays dated November 03, 2018. FINDINGS: No acute fracture or dislocation. The left knee and ankle are unremarkable. Wound VAC along the medial lower leg with mild adjacent soft tissue swelling. No radiopaque foreign body. IMPRESSION: 1. Medial lower leg wound VAC with mild adjacent soft tissue swelling. 2. No acute osseous abnormality. Electronically Signed   By: Obie DredgeWilliam T Derry M.D.   On: 11/14/2018 22:26    ____________________________________________   PROCEDURES  Procedure(s) performed:   Procedures  None ____________________________________________   INITIAL IMPRESSION / ASSESSMENT AND PLAN / ED COURSE  Pertinent labs & imaging results that were available during my care of the patient were reviewed by me and considered in my medical decision making (see chart for details).  Patient presents to the emergency department for evaluation of pain in the left lower leg.  Wound VAC in place and well-appearing.  The visible suture and incision are also well-appearing with no surrounding cellulitis.  The leg is not warm to touch.  There is some surrounding bruising which would be expected after traumatic injury and surgery.  The left leg is more swollen than the right raising suspicion for possible DVT.  I do not have ultrasound available at this hour.  Patient screening labs from triage show a leukocytosis.  Patient has no focal abdominal tenderness.  Given her diarrhea symptoms plan for screening labs, IV fluids, pain control, and  reassess.  Patient is meeting with her plastic surgeon in the morning.  Pain well-controlled here. Labs reviewed with mild leukocytosis but no clear infection. X-ray without bony involvement. Plan for lovenox and Venous US in the AM to r/o DVT. Discussed ED return precautions in detail.  ____________________________________________  FINAL CLINICAL IMPRESSION(S) / ED DIAGNOSES  Final diagnoses:  Left leg pain    MEDICATIONS GIVEN DURING THIS VISIT:  Medications  sodium chloride 0.9 % bolus 500 mL (0 mLs Intravenous Stopped 11/14/18 2345)  morphine 4 MG/ML injection 4 mg (4 mg Intravenous Given 11/14/18 2218)  enoxaparin (LOVENOX) injection 100 mg (100 mg Subcutaneous Given 11/14/18 2344)     NEW OUTPATIENT MEDICATIONS STARTED DURING THIS VISIT:  Discharge Medication List as of 11/14/2018 10:54 PM    START taking these medications   Details  HYDROcodone-acetaminophen (NORCO/VICODIN) 5-325 MG tablet Take 2 tablets by mouth every 6 (six) hours as needed for up to 5 days for severe pain., Starting Thu 11/14/2018, Until Tue 11/19/2018, Print        Note:  This document was prepared using Dragon voice recognition software and may  include unintentional dictation errors.  Alona BeneJoshua Suraiya Dickerson, MD Emergency Medicine    Nima Kemppainen, Arlyss RepressJoshua G, MD 11/15/18 [phone]

## 2018-11-15 ENCOUNTER — Ambulatory Visit (INDEPENDENT_AMBULATORY_CARE_PROVIDER_SITE_OTHER): Payer: Self-pay | Admitting: Plastic Surgery

## 2018-11-15 ENCOUNTER — Encounter: Payer: Self-pay | Admitting: Plastic Surgery

## 2018-11-15 ENCOUNTER — Ambulatory Visit (HOSPITAL_COMMUNITY)
Admission: RE | Admit: 2018-11-15 | Discharge: 2018-11-15 | Disposition: A | Discharge status: Home / Self Care | Payer: Self-pay | Source: Ambulatory Visit | Attending: Emergency Medicine | Admitting: Emergency Medicine

## 2018-11-15 VITALS — BP 120/90 | HR 75 | Ht 64.0 in | Wt 220.2 lb

## 2018-11-15 DIAGNOSIS — M7989 Other specified soft tissue disorders: Secondary | ICD-10-CM | POA: Insufficient documentation

## 2018-11-15 DIAGNOSIS — L03116 Cellulitis of left lower limb: Secondary | ICD-10-CM

## 2018-11-15 DIAGNOSIS — S81802A Unspecified open wound, left lower leg, initial encounter: Secondary | ICD-10-CM

## 2018-11-15 DIAGNOSIS — S81002A Unspecified open wound, left knee, initial encounter: Secondary | ICD-10-CM

## 2018-11-15 DIAGNOSIS — S91002A Unspecified open wound, left ankle, initial encounter: Secondary | ICD-10-CM

## 2018-11-15 DIAGNOSIS — W540XXA Bitten by dog, initial encounter: Secondary | ICD-10-CM

## 2018-11-15 NOTE — Progress Notes (Signed)
   Subjective:    Patient ID: Ann Randall, female    DOB: 09/18/1971, 47 y.o.   MRN: [id number]  The patient is a 47 year old white female here with a friend for follow-up on her dog bite.  She was bitten by a dog 2 weeks ago in the left leg.  She had severe tissue loss.  She was taken to the OR and underwent debridement with general surgery and then debridement with us.  ACell and the VAC were placed.  She is now at home and has home health.  The area has improved a great deal.  The wound is filling in.  The size is 9 x 12 cm but the depth is better.  It is ~ 5 mm in depth.  No sign of infection.  The acell is incorporating well.       Review of Systems  Constitutional: Positive for activity change.  HENT: Negative.   Eyes: Negative.   Respiratory: Negative.   Cardiovascular: Negative.   Gastrointestinal: Negative.   Endocrine: Negative.   Genitourinary: Negative.   Musculoskeletal: Negative.   Skin: Positive for wound.       Objective:   Physical Exam Vitals signs and nursing note reviewed.  Constitutional:      Appearance: Normal appearance.  HENT:     Head: Normocephalic.  Neck:     Musculoskeletal: Normal range of motion.  Cardiovascular:     Rate and Rhythm: Normal rate.  Skin:    General: Skin is warm.  Neurological:     Mental Status: She is alert.  Psychiatric:        Mood and Affect: Mood normal.        Thought Content: Thought content normal.        Judgment: Judgment normal.       Assessment & Plan:  Dog bite, initial encounter  Open wound of left knee, leg, and ankle with complication, initial encounter  Continue with the VAC change with home health at home.  Today we placed Adaptic and KY with a sterile dressing.  I reminded her to increase her protein and be sure she is doing a multivitamin.  I would like to see her back in 2 weeks.  We may need to do 1 more round of the ACell prior to a skin graft in order to get the wound flush with the surrounding  tissue

## 2018-11-15 NOTE — Addendum Note (Signed)
Addended by: Peggye FormILLINGHAM, CLAIRE S on: 11/15/2018 09:21 PM   Modules accepted: Orders

## 2018-11-15 NOTE — Progress Notes (Signed)
Left lower extremity venous duplex completed - Full preliminary results in chart review CV proc. There is no evidence of DVT noted in the areas able to be imaged. Technically difficult due to wound vac placement. [employer]ia Corita Allinson, RVS 11/15/2018, 10:06 AM

## 2018-11-17 ENCOUNTER — Emergency Department (HOSPITAL_COMMUNITY)
Admission: EM | Admit: 2018-11-17 | Discharge: 2018-11-17 | Disposition: A | Discharge status: Home / Self Care | Payer: Self-pay | Attending: Emergency Medicine | Admitting: Emergency Medicine

## 2018-11-17 ENCOUNTER — Encounter (HOSPITAL_COMMUNITY): Payer: Self-pay | Admitting: Emergency Medicine

## 2018-11-17 DIAGNOSIS — Z79899 Other long term (current) drug therapy: Secondary | ICD-10-CM | POA: Insufficient documentation

## 2018-11-17 DIAGNOSIS — Z5189 Encounter for other specified aftercare: Secondary | ICD-10-CM

## 2018-11-17 DIAGNOSIS — L03116 Cellulitis of left lower limb: Secondary | ICD-10-CM

## 2018-11-17 LAB — COMPREHENSIVE METABOLIC PANEL
ALT: 26 U/L (ref 0–44)
AST: 43 U/L — AB (ref 15–41)
Albumin: 3.5 g/dL (ref 3.5–5.0)
Alkaline Phosphatase: 80 U/L (ref 38–126)
Anion gap: 13 (ref 5–15)
BUN: 7 mg/dL (ref 6–20)
CALCIUM: 7.2 mg/dL — AB (ref 8.9–10.3)
CO2: 24 mmol/L (ref 22–32)
Chloride: 105 mmol/L (ref 98–111)
Creatinine, Ser: 0.63 mg/dL (ref 0.44–1.00)
GFR calc Af Amer: >60 mL/min (ref >60)
GFR calc non Af Amer: >60 mL/min (ref >60)
Glucose, Bld: 101 mg/dL — ABNORMAL HIGH (ref 70–99)
Potassium: 3.5 mmol/L (ref 3.5–5.1)
Sodium: 142 mmol/L (ref 135–145)
Total Bilirubin: 0.5 mg/dL (ref 0.3–1.2)
Total Protein: 7.5 g/dL (ref 6.5–8.1)

## 2018-11-17 LAB — URINALYSIS, ROUTINE W REFLEX MICROSCOPIC
Bilirubin Urine: NEGATIVE
GLUCOSE, UA: NEGATIVE mg/dL
HGB URINE DIPSTICK: NEGATIVE
Ketones, ur: NEGATIVE mg/dL
Leukocytes, UA: NEGATIVE
Nitrite: NEGATIVE
Protein, ur: NEGATIVE mg/dL
Specific Gravity, Urine: 1.025 (ref 1.005–1.030)
pH: 6 (ref 5.0–8.0)

## 2018-11-17 LAB — CBC WITH DIFFERENTIAL/PLATELET
Abs Immature Granulocytes: 0.06 10*3/uL (ref 0.00–0.07)
Basophils Absolute: 0 10*3/uL (ref 0.0–0.1)
Basophils Relative: 0 %
Eosinophils Absolute: 0.2 10*3/uL (ref 0.0–0.5)
Eosinophils Relative: 2 %
HEMATOCRIT: 42.6 % (ref 36.0–46.0)
Hemoglobin: 13.7 g/dL (ref 12.0–15.0)
Immature Granulocytes: 1 %
Lymphocytes Relative: 25 %
Lymphs Abs: 2.2 10*3/uL (ref 0.7–4.0)
MCH: 32.5 pg (ref 26.0–34.0)
MCHC: 32.2 g/dL (ref 30.0–36.0)
MCV: 101.2 fL — ABNORMAL HIGH (ref 80.0–100.0)
MONO ABS: 0.4 10*3/uL (ref 0.1–1.0)
Monocytes Relative: 4 %
Neutro Abs: 6.1 10*3/uL (ref 1.7–7.7)
Neutrophils Relative %: 68 %
Platelets: 283 10*3/uL (ref 150–400)
RBC: 4.21 MIL/uL (ref 3.87–5.11)
RDW: 13.2 % (ref 11.5–15.5)
WBC: 8.9 10*3/uL (ref 4.0–10.5)
nRBC: 0 % (ref 0.0–0.2)

## 2018-11-17 LAB — I-STAT CG4 LACTIC ACID, ED: Lactic Acid, Venous: 1.37 mmol/L (ref 0.5–1.9)

## 2018-11-17 LAB — PREGNANCY, URINE: Preg Test, Ur: NEGATIVE

## 2018-11-17 MED ORDER — SODIUM CHLORIDE 0.9 % IV BOLUS
1000.0000 mL | Freq: Once | INTRAVENOUS | Status: AC
  Administered 2018-11-17: 1000 mL via INTRAVENOUS

## 2018-11-17 MED ORDER — SODIUM CHLORIDE 0.9 % IV SOLN
INTRAVENOUS | Status: DC | PRN
  Administered 2018-11-17: 45 mL via INTRAVENOUS

## 2018-11-17 MED ORDER — PIPERACILLIN-TAZOBACTAM 3.375 G IVPB 30 MIN
3.3750 g | INTRAVENOUS | Status: AC
  Administered 2018-11-17: 3.375 g via INTRAVENOUS
  Filled 2018-11-17: qty 50

## 2018-11-17 MED ORDER — PIPERACILLIN-TAZOBACTAM 3.375 G IVPB: 3.3750 g | Freq: Three times a day (TID) | INTRAVENOUS | Status: DC

## 2018-11-17 MED ORDER — MORPHINE SULFATE (PF) 4 MG/ML IV SOLN
4.0000 mg | Freq: Once | INTRAVENOUS | Status: AC
  Administered 2018-11-17: 4 mg via INTRAVENOUS
  Filled 2018-11-17: qty 1

## 2018-11-17 MED ORDER — OXYCODONE HCL 5 MG PO TABS
10.0000 mg | ORAL_TABLET | Freq: Once | ORAL | Status: AC
  Administered 2018-11-17: 10 mg via ORAL
  Filled 2018-11-17: qty 2

## 2018-11-17 MED ORDER — CLINDAMYCIN HCL 150 MG PO CAPS: 300.0000 mg | ORAL_CAPSULE | Freq: Three times a day (TID) | ORAL | 0 refills | Status: AC

## 2018-11-17 NOTE — Discharge Instructions (Addendum)
Use liberal amounts of KY jelly to dress wound and nonadherent gauze and change dressing once daily.

## 2018-11-17 NOTE — ED Triage Notes (Signed)
Pt here, reports home health sent the wrong wound vac supplies for left leg wound, has been unable to get the supplies corrected, Pt reports 10/10 pain.  Wet-dry dressings being put on at home.

## 2018-11-17 NOTE — ED Provider Notes (Addendum)
[facility] Provider Note   CSN: [id number] Arrival date & time: 11/17/18  1132     History   Chief Complaint Chief Complaint  Patient presents with  . Wound Check    HPI Ann Randall is a 47 y.o. female.  HPI  47 year old female with history of recent dog bite with complex wound, requiring irrigation and debridement on 12 8 and 1210, presents with concern for pain at the site of the wound in the setting of inability to replace her wound VAC.  Reports that she went to schedule follow-up appointment on Friday with Dr. Hester MatesBillingham, who removed the wound VAC to evaluate her wound, and had recommended it be replaced by home health.  Reports that home health came to her home, however they are unable to replace the wound VAC as I do not have the right parts.  Reports though she has had increasing pain, and in particular, recommends difficulty using the dressings rather than the wound VAC.  Reports the dressings continue to stick to her wound, causing severe pain.  Reports that she has had both increasing pain at the site of the wound and drainage.  She does not describe radiating pain.  Reports she has had fevers at home.  Denies cough, urinary symptoms  Past Medical History:  Diagnosis Date  . Anemia     Patient Active Problem List   Diagnosis Date Noted  . Dog bite 11/03/2018    Past Surgical History:  Procedure Laterality Date  . ABDOMINAL HYSTERECTOMY    . APPLICATION OF WOUND VAC Left 11/05/2018   Procedure: APPLICATION OF WOUND VAC;  Surgeon: Peggye Formillingham, Claire S, DO;  Location: [facility] OR;  Service: Plastics;  Laterality: Left;  . CHOLECYSTECTOMY    . GASTRIC BYPASS    . I&D EXTREMITY Left 11/03/2018   Procedure: DEBRIDEMENT OF LEFT LOWER EXTREMITY WOUND;  Surgeon: Berna Bueonnor, Chelsea A, MD;  [facility];  Service: General;  Laterality: Left;  . I&D EXTREMITY Left 11/05/2018   Procedure: IRRIGATION AND DEBRIDEMENT OF LEG WITH A-CELL;   Surgeon: Peggye Formillingham, Claire S, DO;  Location: [facility] OR;  Service: Plastics;  Laterality: Left;  . TUBAL LIGATION       OB History   No obstetric history on file.      Home Medications    Prior to Admission medications   Medication Sig Start Date End Date Taking? Authorizing Provider  acetaminophen (TYLENOL) 500 MG tablet Take 2 tablets (1,000 mg total) by mouth every 8 (eight) hours as needed. 11/07/18  Yes Meuth, Brooke A, PA-C  calcitRIOL (ROCALTROL) 0.5 MCG capsule Take 1 mcg by mouth 2 (two) times daily.    Yes [provider]  Calcium-Magnesium-Vitamin D (CALCIUM 1200+D3 PO) Take 4 tablets by mouth 2 (two) times daily.   Yes [provider]  docusate sodium (COLACE) 100 MG capsule Take 1 capsule (100 mg total) by mouth 2 (two) times daily. 11/07/18  Yes Meuth, Brooke A, PA-C  gabapentin (NEURONTIN) 300 MG capsule Take 1 capsule (300 mg total) by mouth 3 (three) times daily. 11/07/18  Yes Meuth, Brooke A, PA-C  levothyroxine (SYNTHROID, LEVOTHROID) 100 MCG tablet Take 150 mcg by mouth daily before breakfast.   Yes [provider]  magnesium oxide (MAG-OX) 400 MG tablet Take 400 mg by mouth every morning.   Yes [provider]  methocarbamol (ROBAXIN) 500 MG tablet Take 2 tablets (1,000 mg total) by mouth every 8 (eight) hours as needed for muscle  spasms. 11/07/18  Yes Meuth, Brooke A, PA-C  naproxen sodium (ALEVE) 220 MG tablet Take 220-660 mg by mouth daily as needed (pain).   Yes [provider]  polyethylene glycol (MIRALAX / GLYCOLAX) packet Take 17 g by mouth daily. 11/07/18  Yes Meuth, Brooke A, PA-C  sertraline (ZOLOFT) 50 MG tablet Take 50 mg by mouth 2 (two) times daily.   Yes [provider]  topiramate (TOPAMAX) 100 MG tablet Take 100 mg by mouth at bedtime as needed (migraine).   Yes [provider]  Vitamin D, Ergocalciferol, (DRISDOL) 1.25 MG (50000 UT) CAPS capsule Take 50,000 Units by mouth every Sunday.   Yes  [provider]  cephALEXin (KEFLEX) 500 MG capsule Take 1 capsule (500 mg total) by mouth 4 (four) times daily. 12/05/18   Mayo, Baxter Kail, PA-C    Family History History reviewed. No pertinent family history.  Social History Social History   Tobacco Use  . Smoking status: Never Smoker  . Smokeless tobacco: Never Used  Substance Use Topics  . Alcohol use: Yes    Comment: Wine  . Drug use: Never     Allergies   Patient has no known allergies.   Review of Systems Review of Systems  Constitutional: Negative for fever.  Respiratory: Negative for shortness of breath.   Cardiovascular: Negative for chest pain.  Gastrointestinal: Negative for abdominal pain, nausea and vomiting.  Genitourinary: Positive for dysuria.  Musculoskeletal: Positive for arthralgias.  Skin: Positive for wound.     Physical Exam Updated Vital Signs BP 106/77 (BP Location: Right Arm)   Pulse 76   Temp 97.9 F (36.6 C) (Oral)   Resp 17   Ht 5\' 4"  (1.626 m)   Wt 99.9 kg   SpO2 93%   BMI 37.80 kg/m   Physical Exam Vitals signs and nursing note reviewed.  Constitutional:      General: She is in acute distress (tearful).     Appearance: She is well-developed. She is not diaphoretic.  HENT:     Head: Normocephalic and atraumatic.  Eyes:     Conjunctiva/sclera: Conjunctivae normal.  Neck:     Musculoskeletal: Normal range of motion.  Cardiovascular:     Rate and Rhythm: Normal rate and regular rhythm.     Heart sounds: Normal heart sounds. No murmur. No friction rub. No gallop.   Pulmonary:     Effort: Pulmonary effort is normal. No respiratory distress.     Breath sounds: Normal breath sounds. No wheezing or rales.  Abdominal:     General: There is no distension.     Palpations: Abdomen is soft.     Tenderness: There is no abdominal tenderness. There is no guarding.  Musculoskeletal:        General: No tenderness.     Comments: Left lower extremity with wound, no  significant swelling  Skin:    General: Skin is warm and dry.     Findings: No erythema or rash.     Comments: approx 10x12cm wound open, exposed adipose tissue, fascia mild erythema at edge throughout, minimal cellulitis extending frmo inferior portion of wound  Neurological:     Mental Status: She is alert and oriented to person, place, and time.      ED Treatments / Results  Labs (all labs ordered are listed, but only abnormal results are displayed) Labs Reviewed  COMPREHENSIVE METABOLIC PANEL - Abnormal; Notable for the following components:      Result Value  Glucose, Bld 101 (*)    Calcium 7.2 (*)    AST 43 (*)    All other components within normal limits  CBC WITH DIFFERENTIAL/PLATELET - Abnormal; Notable for the following components:   MCV 101.2 (*)    All other components within normal limits  URINALYSIS, ROUTINE W REFLEX MICROSCOPIC - Abnormal; Notable for the following components:   Color, Urine YELLOW (*)    APPearance CLEAR (*)    All other components within normal limits  CULTURE, BLOOD (ROUTINE X 2)  CULTURE, BLOOD (ROUTINE X 2)  PREGNANCY, URINE  I-STAT CG4 LACTIC ACID, ED    EKG EKG Interpretation  Date/Time:  Sunday November 17 2018 13:04:25 EST Ventricular Rate:  71 PR Interval:    QRS Duration: 90 QT Interval:  432 QTC Calculation: 470 R Axis:   17 Text Interpretation:  Sinus rhythm Low voltage, precordial leads Anteroseptal infarct, age indeterminate Baseline wander in lead(s) V5 Nonspecific ST and T wave abnormality No previous ECGs available Reconfirmed by Hillis Mcphatter ([id number]) on 12/16/2018 10:31:16 PM   Radiology No results found.  Procedures Procedures (including critical care time)  Medications Ordered in ED Medications  sodium chloride 0.9 % bolus 1,000 mL (0 mLs Intravenous Stopped 11/17/18 1425)  morphine 4 MG/ML injection 4 mg (4 mg Intravenous Given 11/17/18 1322)  piperacillin-tazobactam (ZOSYN) IVPB 3.375 g (0 g Intravenous  Stopped 11/17/18 1425)  oxyCODONE (Oxy IR/ROXICODONE) immediate release tablet 10 mg (10 mg Oral Given 11/17/18 1423)     Initial Impression / Assessment and Plan / ED Course  I have reviewed the triage vital signs and the nursing notes.  Pertinent labs & imaging results that were available during my care of the patient were reviewed by me and considered in my medical decision making (see chart for details).     47 year old female with history of recent dog bite with complex wound, requiring irrigation and debridement on 12 8 and 1210, presents with concern for pain at the site of the wound in the setting of inability to replace her wound VAC.  Initially given pt reported fevers, increasing pain, loss of wound vac, ordered labs and abx for wound infection secondary to dog bite with plan to have wound care consult when available for wound vac replacement.  Discussed with Dr. Dillingham, plastic surgery, given no fevers in the ED, no leukocytosis, do not feel admission indicated. For wound care, she recommends ky jelly. Discussed with patient liberal use of ky and nonadherent gauze.  Recommend continuing to follow pain regimen rx she has been given. Labs without acute abnormalities. No fever while in ED. Very mild/early cellulitis possible at edge of wound and given other symptoms described, feel abx reasonable and given rx for clindamycin.   Final Clinical Impressions(s) / ED Diagnoses   Final diagnoses:  Visit for wound check  Cellulitis of left lower extremity    ED Discharge Orders         Ordered    clindamycin (CLEOCIN) 150 MG capsule  3 times daily     12 /22/19 1555           Alvira MondaySchlossman, Clarissia Mckeen, MD 11/18/18 Marlyne Beards0002    Alvira MondaySchlossman, Thanh Pomerleau, MD 12/16/18 2231

## 2018-11-17 NOTE — Progress Notes (Signed)
Pharmacy Antibiotic Note  Ann MarshallKimberly Randall is a 47 y.o. female admitted on 11/17/2018 with sepsis - open wound of LLE after dog bite - pt being followed by plastics.  Pharmacy has been consulted for Zosyn dosing.  Plan: Zosyn 3.375gm IV now over 30 min then 3.375gm IV q8h - subsequent doses over 4 hours Will f/u micro data, renal function, and pt's clinical condition    Height: 5\' 4"  (162.6 cm) Weight: 220 lb 3.8 oz (99.9 kg) IBW/kg (Calculated) : 54.7  Temp (24hrs), Avg:98.3 F (36.8 C), Min:98.3 F (36.8 C), Max:98.3 F (36.8 C)  Recent Labs  Lab 11/14/18 2052 11/14/18 2226  WBC 13.4*  --   CREATININE 0.80  --   LATICACIDVEN  --  1.39    Estimated Creatinine Clearance: 97.8 mL/min (by C-G formula based on SCr of 0.8 mg/dL).    No Known Allergies  Antimicrobials this admission: 12/22 Zosyn >>   Microbiology results: 12/22 BCx:   Thank you for allowing pharmacy to be a part of this patient's care.  Christoper Fabianaron Jaden Abreu, PharmD, BCPS Clinical pharmacist  **Pharmacist phone directory can now be found on [email or web address] ([email or web address]).  List[facility] under [facility]. 11/17/2018 12:07 PM

## 2018-11-18 ENCOUNTER — Encounter (HOSPITAL_COMMUNITY): Payer: Self-pay | Admitting: Plastic Surgery

## 2018-11-19 ENCOUNTER — Telehealth: Payer: Self-pay

## 2018-11-19 NOTE — Telephone Encounter (Signed)
11/18/18- call from Roslynn AmbleKim Hanley RN from [facility] (ph# [phone]) RN calls to report that she was at patient's home to perform wound vac dressing change, & said that per her orders Dr. Ulice Boldillingham ordered new application of "A-cell"& she needed verification of orders. I routed this encounter to [facility], [place]. For further plan of care. [employer]ita

## 2018-11-21 ENCOUNTER — Telehealth: Payer: Self-pay

## 2018-11-21 NOTE — Telephone Encounter (Signed)
Telephone from [facility]: Evangeline GulaKaren,RN (ph# [phone]) regarding pt's Home health visit on 11/18/18 RN noted that the wound vac had been removed by pt. & pt refused to have the vac reapplied, she asked that they perform a "wet to dry" dressing instead. RN is calling to verify if they should continue dressing change "wet to dry" today. I instructed them to perform the dressing change today & assess the wound, they will call for any concerns We will schedule patient for f/u visit with Dr. Ulice Boldillingham for 11/22/18

## 2018-11-21 NOTE — Telephone Encounter (Signed)
[facility] inform patient of appointment with Dr. Ulice Boldillingham . On 11/22/18 @ 10:15 am No answer- she left message for pt to call back  [facility]

## 2018-11-22 LAB — CULTURE, BLOOD (ROUTINE X 2)
CULTURE: NO GROWTH
Culture: NO GROWTH
Special Requests: ADEQUATE

## 2018-11-29 ENCOUNTER — Encounter: Payer: Self-pay | Admitting: Physician Assistant

## 2018-11-29 ENCOUNTER — Ambulatory Visit (INDEPENDENT_AMBULATORY_CARE_PROVIDER_SITE_OTHER): Payer: Self-pay | Admitting: Physician Assistant

## 2018-11-29 VITALS — BP 126/93 | HR 96 | Temp 98.0°F | Ht 64.0 in | Wt 220.0 lb

## 2018-11-29 DIAGNOSIS — S81801S Unspecified open wound, right lower leg, sequela: Secondary | ICD-10-CM

## 2018-11-29 DIAGNOSIS — W540XXD Bitten by dog, subsequent encounter: Secondary | ICD-10-CM

## 2018-11-29 DIAGNOSIS — S81851D Open bite, right lower leg, subsequent encounter: Secondary | ICD-10-CM

## 2018-11-29 NOTE — Progress Notes (Signed)
  Subjective:     Patient ID: Ann Randall, female   DOB: 1971-08-23, 48 y.o.   MRN: [id number]  HPI  Very pleasant 48 year old female pt presents to the clinic s/p irrigation, debridement, placement of ACell and wound VAC on 11/05/18.  Patient reports difficulty with home health she says sometimes they do wet-to-dry dressings and other times they will  place the wound VAC.  Patient does present with wound VAC on today.  Some ecchymosis and edema is still noted.  Patient says she does not elevate her legs as often as she should. She still reports increased pain.   Review of Systems  Constitutional: Negative.   Respiratory: Negative.   Cardiovascular: Negative.   Gastrointestinal: Negative.   Skin: Positive for color change and wound.  Psychiatric/Behavioral: Negative.        Objective:   Physical Exam Constitutional:      Appearance: She is obese.  Pulmonary:     Effort: Pulmonary effort is normal.  Abdominal:     Palpations: Abdomen is soft.  Skin:    General: Skin is warm and dry.  Neurological:     Mental Status: She is alert.  Psychiatric:        Mood and Affect: Mood normal.        Behavior: Behavior normal.        Thought Content: Thought content normal.        Judgment: Judgment normal.   Wound is 9x7 cm Good granulation tissue is noted No sign of infection     Assessment:     S/p Dog bite wound    Plan:     Removed and replaced wound VAC dressing today in clinic.  Also removed every other suture placed gauze dressing with Vaseline over remaining sutures Advised continued healthy eating increase protein Advised elevation of legs to decrease edema Patient will return to clinic in 2 weeks She will continue biweekly wound VAC dressing changes with home health   We did recommend use of NSAIDS.

## 2018-12-04 ENCOUNTER — Telehealth: Payer: Self-pay | Admitting: Plastic Surgery

## 2018-12-04 NOTE — Telephone Encounter (Signed)
Received a call from patient with concerns on her wound. Patient explains that she is having difficulties with her wound vac sealing and leaking. Patient is also expressing concern about the smell and odor of the wound.   Patient states that home health has been dressing the wound, but she feels that she was "doing better" with a wet-to-dry method.   Patient stated that she completed the antibiotics that were prescribed previously, but has others that she has not started. Patient stated that she no longer has any supplies for her vac, but they have been ordered. Made patient an appointment with PA and requested that patient bring any supplies that she has at the moment. Patient transferred to speak with clinical staff.

## 2018-12-04 NOTE — Telephone Encounter (Signed)
Call from patient regarding wound vac & wound care Pt states that her wound vac dressing was "leaking " and not holding a charge & not evacuating. She reports that she has recharged the battery & plugged the unit in to outlet to eliminate the possibility of "low battery" malfunction, however the vac continues to alert to a leak in the system. She is attempting to use tape to create a seal. She reports that she will perform a "wet to dry" dressing if it does not work properly  Pt also reports a "foul odor" from the wound & indicates that she may need more antibiotics She completed a course of antibiotics 2 weeks ago  Pt has an appointment with Anette Riedel, PA here at the office 12/05/18 @ 9:45am  Pt agrees with plan of care  [place]

## 2018-12-05 ENCOUNTER — Encounter: Payer: Self-pay | Admitting: Physician Assistant

## 2018-12-05 ENCOUNTER — Ambulatory Visit (INDEPENDENT_AMBULATORY_CARE_PROVIDER_SITE_OTHER): Payer: Self-pay | Admitting: Physician Assistant

## 2018-12-05 VITALS — BP 115/78 | HR 97 | Ht 64.0 in | Wt 219.0 lb

## 2018-12-05 DIAGNOSIS — S81802S Unspecified open wound, left lower leg, sequela: Secondary | ICD-10-CM

## 2018-12-05 MED ORDER — CEPHALEXIN 500 MG PO CAPS: 500.0000 mg | ORAL_CAPSULE | Freq: Four times a day (QID) | ORAL | 0 refills | Status: DC

## 2018-12-05 NOTE — Progress Notes (Signed)
  Subjective:     Patient ID: Ann Randall, female   DOB: 1971/06/21, 48 y.o.   MRN: [id number]  HPI Pleasant 47 year old female patient presents the clinic and treatment of the wound on her left lower extremity.  Patient unfortunately sustained a dog bite.  Patient was taken to the OR on 11/05/2018 by Dr. Ulice Bold.  Irrigation and debridement ACell and wound VAC placement on that day.  Patient was in our office on 11/29/18 when we replaced the wound VAC.  Patient said it was ripped off her leg that same day.  Patient was at the surgical center all day yesterday because of her knees having mouth surgery.  She presents the clinic today with increasing pain and erythema around the wound.  She denies fevers chills night sweats. Pt reports significant odor from the wound yesterday when she was wearing the wound vac.  Review of Systems  Constitutional: Negative.   Respiratory: Negative.   Cardiovascular: Negative.   Gastrointestinal: Negative.   Skin: Positive for wound.  Psychiatric/Behavioral: Negative.        Objective:   Physical Exam Constitutional:      Appearance: Normal appearance.  Pulmonary:     Effort: Pulmonary effort is normal.  Abdominal:     Palpations: Abdomen is soft.  Skin:    General: Skin is warm and dry.     Findings: Erythema present.  Neurological:     Mental Status: She is alert and oriented to person, place, and time.   visualized 9x7 wound on LLE No Malodor noted Wound vac is not on Edema and erythema noted     Assessment:     Wound of the left lower extremity Dog Bite S/p Debridement, acell placement Cellulitis    Plan:     Sent in Keflex for the pt to treat cellulitis Placed Adaptic, gauze, Kerlix and ace wrapped leg Advised pt to elevate leg Advised the pt to shower in the am prior to [facility] nurse coming out to replace wound vac Pt will return to clinic on Tuesday She will contact our office with any concerns

## 2018-12-09 ENCOUNTER — Telehealth: Payer: Self-pay | Admitting: Plastic Surgery

## 2018-12-09 NOTE — Telephone Encounter (Signed)
I contacted the wound care nurse. They will replace the vac today.  The pt had informed them that she was not going to make her appt tomorrow.  Please try to reschedule her for Friday.   Thank you

## 2018-12-09 NOTE — Telephone Encounter (Signed)
Ann Randall form home health called regarding the patient. Ann Randall explained that the patient was unhappy with the wound vac and has removed it again. Patient has stopped wet-to-dry method of wound care over the weekend and has showered without any protection to the wound. Lauren, RN requesting a call back for orders as to how to care for the wound as she is going to the patient's house again today. Number is [phone].

## 2018-12-10 ENCOUNTER — Telehealth: Payer: Self-pay | Admitting: *Deleted

## 2018-12-10 ENCOUNTER — Ambulatory Visit: Payer: Self-pay | Admitting: Physician Assistant

## 2018-12-10 NOTE — Telephone Encounter (Signed)
Received call from [facility] with [facility] stating that she received a call from the patient regarding the Wound Vac.  Patient stated that the Wound Vac is messing up again.  It's showing a message-"Low pressure alert".  Vikki Ports stated that the patient does have an appointment with [place] on Friday.  She is just needing orders on what to do.  Replace the Wound Vac or do wet to dry dressings until her appointment on Friday.//AB/CMA

## 2018-12-10 NOTE — Telephone Encounter (Signed)
Called [facility] back and spoke with Vikki Ports and informed her that the patient will be coming to see [place] tomorrow in the office, so they do not have to worry about it.  Valerie verbalized understanding agreed.//AB/CMA

## 2018-12-11 ENCOUNTER — Ambulatory Visit (INDEPENDENT_AMBULATORY_CARE_PROVIDER_SITE_OTHER): Payer: Self-pay | Admitting: Physician Assistant

## 2018-12-11 ENCOUNTER — Encounter: Payer: Self-pay | Admitting: Physician Assistant

## 2018-12-11 VITALS — BP 132/86 | HR 80 | Temp 98.4°F | Ht 64.0 in | Wt 219.0 lb

## 2018-12-11 DIAGNOSIS — S81851D Open bite, right lower leg, subsequent encounter: Secondary | ICD-10-CM

## 2018-12-11 DIAGNOSIS — S81802S Unspecified open wound, left lower leg, sequela: Secondary | ICD-10-CM

## 2018-12-11 DIAGNOSIS — W540XXD Bitten by dog, subsequent encounter: Secondary | ICD-10-CM

## 2018-12-11 NOTE — Progress Notes (Signed)
  Subjective:     Patient ID: Ann Randall, female   DOB: [birth year], 48 y.o.   MRN: [id number]  HPI Very pleasant 48 year old female patient presents to clinic as we continue to monitor her leg wound.  The patient was unfortunately the victim of a dog bite.  The patient was taken to surgery by Dr. Ulice Bold on 11/05/2018 for irrigation debridement ACell and wound VAC placement.  The patient has had difficulty with the wound VAC.She presents today with the wound vac off.  The pt reports the vac was leaking and had to be removed.  She also reports difficulty getting supplies.  She does report her pain has decreased significantly.  We are pleased to note the erythema that was present the last time she was in clinic has resolved.  The pt has been compliant with taking prescribed antibiotic. The patient is concerned she will not be healed enough to travel to [place] in March for the birth of her grandchild.   Review of Systems  Constitutional: Negative.   Respiratory: Negative.   Cardiovascular: Negative.   Gastrointestinal: Negative.   Skin: Positive for wound.  Psychiatric/Behavioral: Negative.        Objective:   Physical Exam Constitutional:      Appearance: Normal appearance.  Pulmonary:     Effort: Pulmonary effort is normal.  Abdominal:     Palpations: Abdomen is soft.  Skin:    General: Skin is warm and dry.  Neurological:     Mental Status: She is alert and oriented to person, place, and time.  Psychiatric:        Mood and Affect: Mood normal.        Behavior: Behavior normal.        Thought Content: Thought content normal.        Judgment: Judgment normal.    Wound measured today at 8.5 x 6.5 cm No erythema, decreased edema Wound bed is beefy Wound edges are dry and contracting     Assessment:     S/p dog bite wound S/p irrigation and debridement    Plan:     We will utilize wet to dry dressings today and tomorrow  Wound vac nurse will return on Friday to  replace the vac Dr. Ulice Bold will submit to take the pt back to the OR to place Acell again to smooth out the wound bed.  We will continue to monitor the pts wound and assess need for skin grafting.

## 2018-12-13 ENCOUNTER — Ambulatory Visit: Payer: Self-pay | Admitting: Physician Assistant

## 2018-12-20 ENCOUNTER — Ambulatory Visit (INDEPENDENT_AMBULATORY_CARE_PROVIDER_SITE_OTHER): Payer: Self-pay | Admitting: Plastic Surgery

## 2018-12-20 ENCOUNTER — Encounter: Payer: Self-pay | Admitting: Plastic Surgery

## 2018-12-20 ENCOUNTER — Telehealth: Payer: Self-pay | Admitting: Plastic Surgery

## 2018-12-20 VITALS — BP 114/81 | HR 79 | Temp 98.5°F | Ht 64.0 in | Wt 219.0 lb

## 2018-12-20 DIAGNOSIS — W540XXA Bitten by dog, initial encounter: Secondary | ICD-10-CM

## 2018-12-20 DIAGNOSIS — S81802S Unspecified open wound, left lower leg, sequela: Secondary | ICD-10-CM

## 2018-12-20 NOTE — H&P (View-Only) (Signed)
   Subjective:    Patient ID: Ann Randall, female    DOB: 05/12/1971, 47 y.o.   MRN: [id number]  This patient is a 47-year-old white female for follow-up on her left leg dog bite.  She is feeling in the wound very nicely it is almost flush with the surrounding skin.  She has very good granulation tissue.  I do not see any sign of infection.  Her swelling is minimal.  She is trying to keep her leg elevated.    Review of Systems  Constitutional: Positive for activity change. Negative for appetite change.  HENT: Negative.   Eyes: Negative.   Respiratory: Negative.   Gastrointestinal: Negative.   Endocrine: Negative.   Genitourinary: Negative.   Musculoskeletal: Negative.   Skin: Negative.   Psychiatric/Behavioral: Negative.        Objective:   Physical Exam Vitals signs and nursing note reviewed.  Constitutional:      Appearance: Normal appearance.  HENT:     Head: Normocephalic and atraumatic.     Nose: Nose normal.  Eyes:     Extraocular Movements: Extraocular movements intact.  Cardiovascular:     Rate and Rhythm: Normal rate.  Pulmonary:     Effort: Pulmonary effort is normal.  Musculoskeletal:       Legs:  Neurological:     General: No focal deficit present.     Mental Status: She is alert.  Psychiatric:        Mood and Affect: Mood normal.        Thought Content: Thought content normal.        Judgment: Judgment normal.           Assessment & Plan:   

## 2018-12-20 NOTE — Progress Notes (Signed)
   Subjective:    Patient ID: Ann Randall, female    DOB: [birth year], 48 y.o.   MRN: [id number]  This patient is a 48 year old white female for follow-up on her left leg dog bite.  She is feeling in the wound very nicely it is almost flush with the surrounding skin.  She has very good granulation tissue.  I do not see any sign of infection.  Her swelling is minimal.  She is trying to keep her leg elevated.    Review of Systems  Constitutional: Positive for activity change. Negative for appetite change.  HENT: Negative.   Eyes: Negative.   Respiratory: Negative.   Gastrointestinal: Negative.   Endocrine: Negative.   Genitourinary: Negative.   Musculoskeletal: Negative.   Skin: Negative.   Psychiatric/Behavioral: Negative.        Objective:   Physical Exam Vitals signs and nursing note reviewed.  Constitutional:      Appearance: Normal appearance.  HENT:     Head: Normocephalic and atraumatic.     Nose: Nose normal.  Eyes:     Extraocular Movements: Extraocular movements intact.  Cardiovascular:     Rate and Rhythm: Normal rate.  Pulmonary:     Effort: Pulmonary effort is normal.  Musculoskeletal:       Legs:  Neurological:     General: No focal deficit present.     Mental Status: She is alert.  Psychiatric:        Mood and Affect: Mood normal.        Thought Content: Thought content normal.        Judgment: Judgment normal.           Assessment & Plan:

## 2018-12-20 NOTE — Telephone Encounter (Signed)
Laverda Page, RN called to give notification that the patient was unable to have home care appointment. Patient has gone out of town without wound vac placed.

## 2018-12-24 NOTE — Telephone Encounter (Signed)
Laverda Page, RN called to give notification that the patient's blood pressure was elevated when she was changing patient's wound vac. Patient's reported pressure was 140/100.

## 2019-01-02 ENCOUNTER — Other Ambulatory Visit: Payer: Self-pay

## 2019-01-02 ENCOUNTER — Encounter (HOSPITAL_BASED_OUTPATIENT_CLINIC_OR_DEPARTMENT_OTHER): Payer: Self-pay | Admitting: *Deleted

## 2019-01-07 ENCOUNTER — Telehealth: Payer: Self-pay

## 2019-01-07 NOTE — Telephone Encounter (Signed)
Call from [facility], RN from [facility] regarding pt wound status As per Corrie Dandy- lower leg wound has improved & decreased in size- measures: height=3cm Width=2.8cm & depth= no depth/surface wound bed  She states that the pt applied wet to dry dressing starting on 01/05/2019 & RN is seeking advise on continuing wet-to-dry drsg v/s reapplying vac - until surgery on 01/09/2019 for skin graft placement  I discussed this with Dr. Ulice Bold and she advised to continue wet-to-dry, but instructed pt to bring her wound vac supplies on day of surgery-because vac will be placed following surgery  Home health RN will forward instructions to pt  RN did advise that pt will be traveling out of town by plane on feb 20- following surgery I will forward this info to Dr. Scheryl Marten, RNFA

## 2019-01-09 ENCOUNTER — Ambulatory Visit (HOSPITAL_BASED_OUTPATIENT_CLINIC_OR_DEPARTMENT_OTHER): Payer: Self-pay | Admitting: Anesthesiology

## 2019-01-09 ENCOUNTER — Encounter (HOSPITAL_BASED_OUTPATIENT_CLINIC_OR_DEPARTMENT_OTHER)
Admission: RE | Disposition: A | Discharge status: Home / Self Care | Payer: Self-pay | Source: Home / Self Care | Attending: Plastic Surgery

## 2019-01-09 ENCOUNTER — Encounter (HOSPITAL_BASED_OUTPATIENT_CLINIC_OR_DEPARTMENT_OTHER): Payer: Self-pay | Admitting: *Deleted

## 2019-01-09 ENCOUNTER — Ambulatory Visit (HOSPITAL_BASED_OUTPATIENT_CLINIC_OR_DEPARTMENT_OTHER)
Admission: RE | Admit: 2019-01-09 | Discharge: 2019-01-09 | Disposition: A | Discharge status: Home / Self Care | Payer: Self-pay | Attending: Plastic Surgery | Admitting: Plastic Surgery

## 2019-01-09 ENCOUNTER — Other Ambulatory Visit: Payer: Self-pay

## 2019-01-09 DIAGNOSIS — E890 Postprocedural hypothyroidism: Secondary | ICD-10-CM | POA: Insufficient documentation

## 2019-01-09 DIAGNOSIS — S81852A Open bite, left lower leg, initial encounter: Secondary | ICD-10-CM | POA: Insufficient documentation

## 2019-01-09 DIAGNOSIS — Z79890 Hormone replacement therapy (postmenopausal): Secondary | ICD-10-CM | POA: Insufficient documentation

## 2019-01-09 DIAGNOSIS — W540XXA Bitten by dog, initial encounter: Secondary | ICD-10-CM | POA: Insufficient documentation

## 2019-01-09 DIAGNOSIS — Z9884 Bariatric surgery status: Secondary | ICD-10-CM | POA: Insufficient documentation

## 2019-01-09 DIAGNOSIS — Z79899 Other long term (current) drug therapy: Secondary | ICD-10-CM | POA: Insufficient documentation

## 2019-01-09 DIAGNOSIS — F329 Major depressive disorder, single episode, unspecified: Secondary | ICD-10-CM | POA: Insufficient documentation

## 2019-01-09 HISTORY — PX: APPLICATION OF A-CELL OF EXTREMITY: SHX6303

## 2019-01-09 HISTORY — DX: Hypothyroidism, unspecified: E03.9

## 2019-01-09 HISTORY — DX: Major depressive disorder, single episode, unspecified: F32.9

## 2019-01-09 HISTORY — PX: IRRIGATION AND DEBRIDEMENT OF WOUND WITH SPLIT THICKNESS SKIN GRAFT: SHX5879

## 2019-01-09 HISTORY — DX: Depression, unspecified: F32.A

## 2019-01-09 SURGERY — IRRIGATION AND DEBRIDEMENT OF WOUND WITH SPLIT THICKNESS SKIN GRAFT
Anesthesia: General | Site: Leg Upper | Laterality: Left

## 2019-01-09 MED ORDER — LIDOCAINE 2% (20 MG/ML) 5 ML SYRINGE
INTRAMUSCULAR | Status: AC
  Filled 2019-01-09: qty 5

## 2019-01-09 MED ORDER — LIDOCAINE HCL (CARDIAC) PF 100 MG/5ML IV SOSY
PREFILLED_SYRINGE | INTRAVENOUS | Status: DC | PRN
  Administered 2019-01-09: 80 mg via INTRAVENOUS

## 2019-01-09 MED ORDER — LACTATED RINGERS IV SOLN
INTRAVENOUS | Status: DC
  Administered 2019-01-09: 10:00:00 via INTRAVENOUS

## 2019-01-09 MED ORDER — MIDAZOLAM HCL 2 MG/2ML IJ SOLN
INTRAMUSCULAR | Status: DC | PRN
  Administered 2019-01-09: 2 mg via INTRAVENOUS

## 2019-01-09 MED ORDER — PHENYLEPHRINE 40 MCG/ML (10ML) SYRINGE FOR IV PUSH (FOR BLOOD PRESSURE SUPPORT)
PREFILLED_SYRINGE | INTRAVENOUS | Status: DC | PRN
  Administered 2019-01-09: 80 ug via INTRAVENOUS

## 2019-01-09 MED ORDER — PROMETHAZINE HCL 25 MG/ML IJ SOLN: 6.2500 mg | INTRAMUSCULAR | Status: DC | PRN

## 2019-01-09 MED ORDER — PROPOFOL 10 MG/ML IV BOLUS
INTRAVENOUS | Status: DC | PRN
  Administered 2019-01-09: 200 mg via INTRAVENOUS

## 2019-01-09 MED ORDER — FENTANYL CITRATE (PF) 100 MCG/2ML IJ SOLN: 50.0000 ug | INTRAMUSCULAR | Status: DC | PRN

## 2019-01-09 MED ORDER — CEFAZOLIN SODIUM-DEXTROSE 2-4 GM/100ML-% IV SOLN
2.0000 g | INTRAVENOUS | Status: AC
  Administered 2019-01-09: 2 g via INTRAVENOUS

## 2019-01-09 MED ORDER — OXYCODONE HCL 5 MG PO TABS
ORAL_TABLET | ORAL | Status: AC
  Filled 2019-01-09: qty 1

## 2019-01-09 MED ORDER — OXYCODONE HCL 5 MG PO TABS: 5.0000 mg | ORAL_TABLET | ORAL | Status: DC | PRN

## 2019-01-09 MED ORDER — HYDROMORPHONE HCL 1 MG/ML IJ SOLN
0.2500 mg | INTRAMUSCULAR | Status: DC | PRN
  Administered 2019-01-09 (×2): 0.5 mg via INTRAVENOUS

## 2019-01-09 MED ORDER — SODIUM CHLORIDE 0.9 % IV SOLN: 250.0000 mL | INTRAVENOUS | Status: DC | PRN

## 2019-01-09 MED ORDER — OXYCODONE HCL 5 MG/5ML PO SOLN: 5.0000 mg | Freq: Once | ORAL | Status: AC | PRN

## 2019-01-09 MED ORDER — MIDAZOLAM HCL 2 MG/2ML IJ SOLN
INTRAMUSCULAR | Status: AC
  Filled 2019-01-09: qty 2

## 2019-01-09 MED ORDER — DEXAMETHASONE SODIUM PHOSPHATE 10 MG/ML IJ SOLN
INTRAMUSCULAR | Status: AC
  Filled 2019-01-09: qty 1

## 2019-01-09 MED ORDER — HYDROMORPHONE HCL 1 MG/ML IJ SOLN
INTRAMUSCULAR | Status: AC
  Filled 2019-01-09: qty 0.5

## 2019-01-09 MED ORDER — FENTANYL CITRATE (PF) 100 MCG/2ML IJ SOLN
INTRAMUSCULAR | Status: DC | PRN
  Administered 2019-01-09 (×2): 50 ug via INTRAVENOUS

## 2019-01-09 MED ORDER — LIDOCAINE-EPINEPHRINE 1 %-1:100000 IJ SOLN
INTRAMUSCULAR | Status: DC | PRN
  Administered 2019-01-09: 10 mL

## 2019-01-09 MED ORDER — ACETAMINOPHEN 650 MG RE SUPP: 650.0000 mg | RECTAL | Status: DC | PRN

## 2019-01-09 MED ORDER — DEXAMETHASONE SODIUM PHOSPHATE 10 MG/ML IJ SOLN
INTRAMUSCULAR | Status: DC | PRN
  Administered 2019-01-09: 5 mg via INTRAVENOUS

## 2019-01-09 MED ORDER — ACETAMINOPHEN 325 MG PO TABS: 650.0000 mg | ORAL_TABLET | ORAL | Status: DC | PRN

## 2019-01-09 MED ORDER — SCOPOLAMINE 1 MG/3DAYS TD PT72: 1.0000 | MEDICATED_PATCH | Freq: Once | TRANSDERMAL | Status: DC | PRN

## 2019-01-09 MED ORDER — SODIUM CHLORIDE 0.9% FLUSH: 3.0000 mL | Freq: Two times a day (BID) | INTRAVENOUS | Status: DC

## 2019-01-09 MED ORDER — HYDROCODONE-ACETAMINOPHEN 5-325 MG PO TABS: 1.0000 | ORAL_TABLET | Freq: Four times a day (QID) | ORAL | 0 refills | Status: AC | PRN

## 2019-01-09 MED ORDER — ONDANSETRON HCL 4 MG/2ML IJ SOLN
INTRAMUSCULAR | Status: DC | PRN
  Administered 2019-01-09: 4 mg via INTRAVENOUS

## 2019-01-09 MED ORDER — PHENYLEPHRINE 40 MCG/ML (10ML) SYRINGE FOR IV PUSH (FOR BLOOD PRESSURE SUPPORT)
PREFILLED_SYRINGE | INTRAVENOUS | Status: AC
  Filled 2019-01-09: qty 10

## 2019-01-09 MED ORDER — ONDANSETRON HCL 4 MG/2ML IJ SOLN
INTRAMUSCULAR | Status: AC
  Filled 2019-01-09: qty 2

## 2019-01-09 MED ORDER — CEFAZOLIN SODIUM-DEXTROSE 2-4 GM/100ML-% IV SOLN
INTRAVENOUS | Status: AC
  Filled 2019-01-09: qty 100

## 2019-01-09 MED ORDER — FENTANYL CITRATE (PF) 100 MCG/2ML IJ SOLN
INTRAMUSCULAR | Status: AC
  Filled 2019-01-09: qty 2

## 2019-01-09 MED ORDER — SODIUM CHLORIDE 0.9% FLUSH: 3.0000 mL | INTRAVENOUS | Status: DC | PRN

## 2019-01-09 MED ORDER — MIDAZOLAM HCL 2 MG/2ML IJ SOLN: 1.0000 mg | INTRAMUSCULAR | Status: DC | PRN

## 2019-01-09 MED ORDER — MEPERIDINE HCL 25 MG/ML IJ SOLN: 6.2500 mg | INTRAMUSCULAR | Status: DC | PRN

## 2019-01-09 MED ORDER — OXYCODONE HCL 5 MG PO TABS
5.0000 mg | ORAL_TABLET | Freq: Once | ORAL | Status: AC | PRN
  Administered 2019-01-09: 5 mg via ORAL

## 2019-01-09 SURGICAL SUPPLY — 82 items
BAG DECANTER FOR FLEXI CONT (MISCELLANEOUS) IMPLANT
BANDAGE ACE 3X5.8 VEL STRL LF (GAUZE/BANDAGES/DRESSINGS) IMPLANT
BANDAGE ACE 4X5 VEL STRL LF (GAUZE/BANDAGES/DRESSINGS) ×4 IMPLANT
BANDAGE ACE 6X5 VEL STRL LF (GAUZE/BANDAGES/DRESSINGS) IMPLANT
BENZOIN TINCTURE PRP APPL 2/3 (GAUZE/BANDAGES/DRESSINGS) IMPLANT
BLADE CLIPPER SURG (BLADE) IMPLANT
BLADE DERMATOME SS (BLADE) ×4 IMPLANT
BLADE HEX COATED 2.75 (ELECTRODE) ×4 IMPLANT
BLADE SURG 10 STRL SS (BLADE) IMPLANT
BLADE SURG 15 STRL LF DISP TIS (BLADE) ×2 IMPLANT
BLADE SURG 15 STRL SS (BLADE) ×2
BNDG COHESIVE 4X5 TAN STRL (GAUZE/BANDAGES/DRESSINGS) IMPLANT
BNDG GAUZE ELAST 4 BULKY (GAUZE/BANDAGES/DRESSINGS) ×4 IMPLANT
CANISTER SUCT 1200ML W/VALVE (MISCELLANEOUS) IMPLANT
CLOSURE WOUND 1/2 X4 (GAUZE/BANDAGES/DRESSINGS)
COTTONBALL LRG STERILE PKG (GAUZE/BANDAGES/DRESSINGS) IMPLANT
COVER BACK TABLE 60X90IN (DRAPES) ×4 IMPLANT
COVER MAYO STAND STRL (DRAPES) ×4 IMPLANT
COVER WAND RF STERILE (DRAPES) IMPLANT
DECANTER SPIKE VIAL GLASS SM (MISCELLANEOUS) IMPLANT
DERMABOND ADVANCED (GAUZE/BANDAGES/DRESSINGS)
DERMABOND ADVANCED .7 DNX12 (GAUZE/BANDAGES/DRESSINGS) IMPLANT
DERMACARRIERS GRAFT 1 TO 1.5 (DISPOSABLE)
DRAPE EXTREMITY T 121X128X90 (DISPOSABLE) IMPLANT
DRAPE INCISE IOBAN 66X45 STRL (DRAPES) IMPLANT
DRAPE LAPAROTOMY 100X72 PEDS (DRAPES) IMPLANT
DRAPE SURG 17X23 STRL (DRAPES) IMPLANT
DRAPE U-SHAPE 76X120 STRL (DRAPES) ×4 IMPLANT
DRESSING DUODERM 4X4 STERILE (GAUZE/BANDAGES/DRESSINGS) ×4 IMPLANT
DRSG ADAPTIC 3X8 NADH LF (GAUZE/BANDAGES/DRESSINGS) ×4 IMPLANT
DRSG EMULSION OIL 3X3 NADH (GAUZE/BANDAGES/DRESSINGS) IMPLANT
DRSG OPSITE 6X11 MED (GAUZE/BANDAGES/DRESSINGS) IMPLANT
DRSG PAD ABDOMINAL 8X10 ST (GAUZE/BANDAGES/DRESSINGS) IMPLANT
DRSG TEGADERM 4X10 (GAUZE/BANDAGES/DRESSINGS) IMPLANT
DRSG VAC ATS SM SENSATRAC (GAUZE/BANDAGES/DRESSINGS) ×4 IMPLANT
ELECT REM PT RETURN 9FT ADLT (ELECTROSURGICAL) ×4
ELECTRODE REM PT RTRN 9FT ADLT (ELECTROSURGICAL) ×2 IMPLANT
GAUZE 4X4 16PLY RFD (DISPOSABLE) IMPLANT
GAUZE SPONGE 4X4 12PLY STRL (GAUZE/BANDAGES/DRESSINGS) ×4 IMPLANT
GLOVE BIO SURGEON STRL SZ 6.5 (GLOVE) ×12 IMPLANT
GLOVE BIO SURGEONS STRL SZ 6.5 (GLOVE) ×4
GOWN STRL REUS W/ TWL LRG LVL3 (GOWN DISPOSABLE) ×10 IMPLANT
GOWN STRL REUS W/TWL LRG LVL3 (GOWN DISPOSABLE) ×10
GRAFT DERMACARRIERS 1 TO 1.5 (DISPOSABLE) IMPLANT
MATRIX WOUND 3-LAYER 5X5 (Tissue) ×3 IMPLANT
NEEDLE PRECISIONGLIDE 27X1.5 (NEEDLE) ×4 IMPLANT
NS IRRIG 1000ML POUR BTL (IV SOLUTION) ×4 IMPLANT
PACK BASIN DAY SURGERY FS (CUSTOM PROCEDURE TRAY) ×4 IMPLANT
PAD CAST 3X4 CTTN HI CHSV (CAST SUPPLIES) IMPLANT
PAD CAST 4YDX4 CTTN HI CHSV (CAST SUPPLIES) IMPLANT
PADDING CAST ABS 4INX4YD NS (CAST SUPPLIES)
PADDING CAST ABS COTTON 4X4 ST (CAST SUPPLIES) IMPLANT
PADDING CAST COTTON 3X4 STRL (CAST SUPPLIES)
PADDING CAST COTTON 4X4 STRL (CAST SUPPLIES)
PENCIL BUTTON HOLSTER BLD 10FT (ELECTRODE) ×4 IMPLANT
SHEET MEDIUM DRAPE 40X70 STRL (DRAPES) IMPLANT
SPONGE LAP 18X18 RF (DISPOSABLE) ×8 IMPLANT
STAPLER VISISTAT 35W (STAPLE) IMPLANT
STOCKINETTE 4X48 STRL (DRAPES) IMPLANT
STOCKINETTE 6  STRL (DRAPES)
STOCKINETTE 6 STRL (DRAPES) IMPLANT
STOCKINETTE IMPERVIOUS LG (DRAPES) IMPLANT
STRIP CLOSURE SKIN 1/2X4 (GAUZE/BANDAGES/DRESSINGS) IMPLANT
SUCTION FRAZIER HANDLE 10FR (MISCELLANEOUS)
SUCTION TUBE FRAZIER 10FR DISP (MISCELLANEOUS) IMPLANT
SURGILUBE 2OZ TUBE FLIPTOP (MISCELLANEOUS) ×4 IMPLANT
SUT MON AB 5-0 PS2 18 (SUTURE) IMPLANT
SUT SILK 3 0 SH CR/8 (SUTURE) IMPLANT
SUT SILK 4 0 PS 2 (SUTURE) IMPLANT
SUT VIC AB 3-0 FS2 27 (SUTURE) IMPLANT
SUT VIC AB 5-0 P-3 18X BRD (SUTURE) IMPLANT
SUT VIC AB 5-0 P3 18 (SUTURE)
SUT VICRYL 4-0 PS2 18IN ABS (SUTURE) IMPLANT
SYR BULB 3OZ (MISCELLANEOUS) ×4 IMPLANT
SYR CONTROL 10ML LL (SYRINGE) ×4 IMPLANT
TOWEL GREEN STERILE FF (TOWEL DISPOSABLE) ×8 IMPLANT
TRAY DSU PREP LF (CUSTOM PROCEDURE TRAY) ×4 IMPLANT
TUBE CONNECTING 20'X1/4 (TUBING) ×1
TUBE CONNECTING 20X1/4 (TUBING) ×3 IMPLANT
UNDERPAD 30X30 (UNDERPADS AND DIAPERS) ×4 IMPLANT
WOUND MATRIX 3-LAYER 5X5 (Tissue) ×1 IMPLANT
YANKAUER SUCT BULB TIP NO VENT (SUCTIONS) ×4 IMPLANT

## 2019-01-09 NOTE — Interval H&P Note (Signed)
History and Physical Interval Note:  01/09/2019 9:53 AM  Ann Randall  has presented today for surgery, with the diagnosis of dog bite, leg wound  The various methods of treatment have been discussed with the patient and family. After consideration of risks, benefits and other options for treatment, the patient has consented to  Procedure(s): Left leg debridement with split thickness skin graft and ACell placement and VAC (Left) as a surgical intervention .  The patient's history has been reviewed, patient examined, no change in status, stable for surgery.  I have reviewed the patient's chart and labs.  Questions were answered to the patient's satisfaction.     Alena Bills Dillingham

## 2019-01-09 NOTE — Anesthesia Postprocedure Evaluation (Signed)
Anesthesia Post Note  Patient: Ann Randall  Procedure(s) Performed: Left leg debridement with split thickness skin graft from upper leg to lower legwith ACell placement and VAC placement (Left Leg Lower)     Patient location during evaluation: PACU Anesthesia Type: General Level of consciousness: awake and alert Pain management: pain level controlled Vital Signs Assessment: post-procedure vital signs reviewed and stable Respiratory status: spontaneous breathing, nonlabored ventilation and respiratory function stable Cardiovascular status: blood pressure returned to baseline and stable Postop Assessment: no apparent nausea or vomiting Anesthetic complications: no    Last Vitals:  Vitals:   01/09/19 1230 01/09/19 1245  BP: (!) 118/93 101/77  Pulse: 93 89  Resp: 16 17  Temp:    SpO2: 96% 92%    Last Pain:  Vitals:   01/09/19 1300  TempSrc:   PainSc: 3                  Lowella Curb

## 2019-01-09 NOTE — Op Note (Signed)
DATE OF OPERATION: 01/09/2019  LOCATION: [facility]  PREOPERATIVE DIAGNOSIS: wound of left leg from dog bite 3 x 6 cm  POSTOPERATIVE DIAGNOSIS: Same  PROCEDURE:  1. Excision of left leg wound skin and soft tissue 3 x 6 cm. 2. Placement of split thickness skin graft to left leg wound 3 x 6 cm. 3. Acell placement to donor site. (5 x 5 cm sheet) 4. VAC to left leg  SURGEON: Claire Sanger Dillingham, DO  ASSISTANT: Carmen Mayo, PA  EBL: 1 cc  CONDITION: Stable  COMPLICATIONS: None  INDICATION: The patient, Ann Randall, is a 48 y.o. female born on 1971/05/13, is here for treatment a traumatic dog bite to the left leg.   PROCEDURE DETAILS:  The patient was seen prior to surgery and marked.  The IV antibiotics were given. The patient was taken to the operating room and given a general anesthetic. A standard time out was performed and all information was confirmed by those in the room. SCD was placed on the right leg.   The left leg was prepped and draped.  The left thigh was prepped and draped.  Local was injected at the donor site of the left leg for intraoperative hemostasis and post operative pain control.  The 3 x 6 cm wound was excised with the #10 blade.  Hemostasis was achieved with pressure.  The dermatome was set at 14/1000 inch.  The graft was obtained from the left thigh.  All of the acell sheet was applied to the donor site and secured with the 5-0 Vicryl.  It was covered with the KY gel and 4 x 4 gauze.  The graft was placed on the wound and secured with the 5-0 Vicryl.  The adaptic was placed over the area after pie crusting.  The vac was applied with a plantar blocking splint and an ace wrap.  The patient was allowed to wake up and taken to recovery room in stable condition at the end of the case. The family was notified at the end of the case.   The advanced practice practitioner (APP) assisted throughout the case.  The APP was essential in retraction and  counter traction when needed to make the case progress smoothly.  This retraction and assistance made it possible to see the tissue plans for the procedure.  The assistance was needed for blood control, tissue re-approximation and assisted with closure of the incision site.

## 2019-01-09 NOTE — Anesthesia Preprocedure Evaluation (Signed)
Anesthesia Evaluation  Patient identified by MRN, date of birth, ID band Patient awake    Reviewed: Allergy & Precautions, NPO status , Patient's Chart, lab work & pertinent test results  History of Anesthesia Complications Negative for: history of anesthetic complications  Airway Mallampati: II  TM Distance: >3 FB Neck ROM: Full    Dental no notable dental hx.    Pulmonary neg pulmonary ROS,    Pulmonary exam normal breath sounds clear to auscultation       Cardiovascular negative cardio ROS Normal cardiovascular exam Rhythm:Regular Rate:Normal     Neuro/Psych PSYCHIATRIC DISORDERS Depression negative neurological ROS     GI/Hepatic negative GI ROS, Neg liver ROS,   Endo/Other  Hypothyroidism   Renal/GU negative Renal ROS  negative genitourinary   Musculoskeletal negative musculoskeletal ROS (+)   Abdominal (+) + obese,   Peds  Hematology negative hematology ROS (+)   Anesthesia Other Findings 48 yo F for I&D/wound vac - depression, hypothyroid, s/p gastric bypass  Reproductive/Obstetrics Pt states pregnancy tests are always positive on her, but she is s/p hysterectomy                             Anesthesia Physical  Anesthesia Plan  ASA: II  Anesthesia Plan: General   Post-op Pain Management:    Induction: Intravenous  PONV Risk Score and Plan: 3 and Ondansetron, Dexamethasone, Midazolam and Treatment may vary due to age or medical condition  Airway Management Planned: LMA  Additional Equipment: None  Intra-op Plan:   Post-operative Plan: Extubation in OR  Informed Consent: I have reviewed the patients History and Physical, chart, labs and discussed the procedure including the risks, benefits and alternatives for the proposed anesthesia with the patient or authorized representative who has indicated his/her understanding and acceptance.       Plan Discussed with:    Anesthesia Plan Comments:         Anesthesia Quick Evaluation

## 2019-01-09 NOTE — Discharge Instructions (Addendum)
Pain medication given  At 1:15 pm today!         INSTRUCTIONS FOR AFTER SURGERY   You are having surgery.  You will likely have some questions about what to expect following your operation.  The following information will help you and your family understand what to expect when you are discharged from the hospital.  Following these guidelines will help ensure a smooth recovery and reduce risks of complications.   Postoperative instructions include information on: diet, wound care, medications and physical activity.  AFTER SURGERY Expect to go home after the procedure.  In some cases, you may need to spend one night in the hospital for observation.  DIET This surgery does not require a specific diet.  However, I have to mention that the healthier you eat the better your body can start healing. It is important to increasing your protein intake.  This means limiting the foods with sugar and carbohydrates.  Focus on vegetables and some meat.  If you have any liposuction during your procedure be sure to drink water.  If your urine is bright yellow, then it is concentrated, and you need to drink more water.  As a general rule after surgery, you should have 8 ounces of water every hour while awake.  If you find you are persistently nauseated or unable to take in liquids let us know.  NO TOBACCO USE or EXPOSURE.  This will slow your healing process and increase the risk of a wound.  WOUND CARE Do not get leg wet and do not remove the vac.  This is very important for the graft to survive.   Use baby wipes until the vac is removed.   No baths, pools or hot tubs for two weeks. No ointment or creams on your incisions until given the go ahead.  Especially not Neosporin (Too many skin reactions with this one).  A few weeks after surgery you can use Mederma and start massaging the scar. We ask you to wear your binder or sports bra for the first 6 weeks around the clock, including while sleeping. This  provides added comfort and helps reduce the fluid accumulation at the surgery site.  ACTIVITY No heavy lifting until cleared by the doctor.  It is OK to walk and climb stairs. In fact, moving your legs is very important to decrease your risk of a blood clot.  It will also help keep you from getting deconditioned.  Every 1 to 2 hours get up and walk for 5 minutes. Keep the splint in place. This will help with a quicker recovery back to normal.  Let pain be your guide so you don't do too much.  NO, you cannot do the spring cleaning and don't plan on taking care of anyone else.  This is your time for TLC.  You will be more comfortable if you sleep and rest with your head elevated either with a few pillows under you or in a recliner.  No stomach sleeping for a few months.  WORK Everyone returns to work at different times. As a rough guide, most people take at least 1 - 2 weeks off prior to returning to work. If you need documentation for your job, bring the forms to your postoperative follow up visit.  DRIVING Arrange for someone to bring you home from the hospital.  You may be able to drive a few days after surgery but not while taking any narcotics or valium.  BOWEL MOVEMENTS Constipation can occur  after anesthesia and while taking pain medication.  It is important to stay ahead for your comfort.  We recommend taking Milk of Magnesia (2 tablespoons; twice a day) while taking the pain pills.  SEROMA This is fluid your body tried to put in the surgical site.  This is normal but if it creates tight skinny skin let us know.  It usually decreases in a few weeks.  WHEN TO CALL Call your surgeon's office if any of the following occur:  Fever 101 degrees F or greater  Excessive bleeding or fluid from the incision site.  Pain that increases over time without aid from the medications  Redness, warmth, or pus draining from incision sites  Persistent nausea or inability to take in liquids  Severe  misshapen area that underwent the operation.     Post Anesthesia Home Care Instructions  Activity: Get plenty of rest for the remainder of the day. A responsible individual must stay with you for 24 hours following the procedure.  For the next 24 hours, DO NOT: -Drive a car -Advertising copywriter -Drink alcoholic beverages -Take any medication unless instructed by your physician -Make any legal decisions or sign important papers.  Meals: Start with liquid foods such as gelatin or soup. Progress to regular foods as tolerated. Avoid greasy, spicy, heavy foods. If nausea and/or vomiting occur, drink only clear liquids until the nausea and/or vomiting subsides. Call your physician if vomiting continues.  Special Instructions/Symptoms: Your throat may feel dry or sore from the anesthesia or the breathing tube placed in your throat during surgery. If this causes discomfort, gargle with warm salt water. The discomfort should disappear within 24 hours.  If you had a scopolamine patch placed behind your ear for the management of post- operative nausea and/or vomiting:  1. The medication in the patch is effective for 72 hours, after which it should be removed.  Wrap patch in a tissue and discard in the trash. Wash hands thoroughly with soap and water. 2. You may remove the patch earlier than 72 hours if you experience unpleasant side effects which may include dry mouth, dizziness or visual disturbances. 3. Avoid touching the patch. Wash your hands with soap and water after contact with the patch.

## 2019-01-09 NOTE — Interval H&P Note (Signed)
History and Physical Interval Note:  01/09/2019 10:55 AM  Ann Randall  has presented today for surgery, with the diagnosis of dog bite, leg wound  The various methods of treatment have been discussed with the patient and family. After consideration of risks, benefits and other options for treatment, the patient has consented to  Procedure(s): Left leg debridement with split thickness skin graft and ACell placement and VAC (Left) as a surgical intervention .  The patient's history has been reviewed, patient examined, no change in status, stable for surgery.  I have reviewed the patient's chart and labs.  Questions were answered to the patient's satisfaction.     Alena Bills Katlin Ciszewski

## 2019-01-09 NOTE — Anesthesia Procedure Notes (Signed)
Procedure Name: LMA Insertion Date/Time: 01/09/2019 11:29 AM Performed by: Yolonda Kida, CRNA Pre-anesthesia Checklist: Patient identified, Emergency Drugs available, Suction available and Patient being monitored Patient Re-evaluated:Patient Re-evaluated prior to induction Oxygen Delivery Method: Circle system utilized Preoxygenation: Pre-oxygenation with 100% oxygen Induction Type: IV induction Ventilation: Mask ventilation without difficulty LMA: LMA inserted LMA Size: 4.0 Number of attempts: 1 Placement Confirmation: positive ETCO2,  CO2 detector and breath sounds checked- equal and bilateral Tube secured with: Tape Dental Injury: Teeth and Oropharynx as per pre-operative assessment

## 2019-01-09 NOTE — Transfer of Care (Signed)
Immediate Anesthesia Transfer of Care Note  Patient: Ann Randall  Procedure(s) Performed: Left leg debridement with split thickness skin graft from upper leg to lower legwith ACell placement and VAC placement (Left Leg Lower)  Patient Location: PACU  Anesthesia Type:General  Level of Consciousness: awake, alert , oriented and patient cooperative  Airway & Oxygen Therapy: Patient Spontanous Breathing and Patient connected to face mask oxygen  Post-op Assessment: Report given to RN and Post -op Vital signs reviewed and stable  Post vital signs: Reviewed and stable  Last Vitals:  Vitals Value Taken Time  BP 122/79 01/09/2019 12:15 PM  Temp    Pulse 97 01/09/2019 12:19 PM  Resp 22 01/09/2019 12:19 PM  SpO2 96 % 01/09/2019 12:19 PM  Vitals shown include unvalidated device data.  Last Pain:  Vitals:   01/09/19 0944  TempSrc: Oral  PainSc: 1       Patients Stated Pain Goal: 3 (01/09/19 0944)  Complications: No apparent anesthesia complications

## 2019-01-10 ENCOUNTER — Encounter (HOSPITAL_BASED_OUTPATIENT_CLINIC_OR_DEPARTMENT_OTHER): Payer: Self-pay | Admitting: Plastic Surgery

## 2019-01-17 ENCOUNTER — Encounter: Payer: Self-pay | Admitting: Plastic Surgery

## 2019-01-17 ENCOUNTER — Ambulatory Visit (INDEPENDENT_AMBULATORY_CARE_PROVIDER_SITE_OTHER): Payer: Self-pay | Admitting: Plastic Surgery

## 2019-01-17 VITALS — BP 123/90 | HR 84 | Temp 98.0°F | Ht 64.0 in | Wt 219.0 lb

## 2019-01-17 DIAGNOSIS — W540XXA Bitten by dog, initial encounter: Secondary | ICD-10-CM

## 2019-01-17 DIAGNOSIS — S81802A Unspecified open wound, left lower leg, initial encounter: Secondary | ICD-10-CM

## 2019-01-17 DIAGNOSIS — Z789 Other specified health status: Secondary | ICD-10-CM | POA: Insufficient documentation

## 2019-01-17 NOTE — Progress Notes (Signed)
   Subjective:    Patient ID: Ann Randall, female    DOB: Oct 07, 1971, 48 y.o.   MRN: [id number]  The patient is a 48 year old female here for follow-up on her left leg wound after a dog bite.  She underwent placement of ACell and the VAC.  She has very good results.  She was taken to the OR a week ago and underwent a split thickness skin graft.  She had the VAC on for a week.  When I took the splint off she had an extremely high amount of animal hair in all levels of her dressing.  She states that she has not removed the dressing.  The VAC was removed and it appears that she has very good take of the skin graft.  I do not see any hematoma or seroma under the graft.  There is no sign of infection.  The surrounding area is clean.       Review of Systems  Constitutional: Negative.   HENT: Negative.   Eyes: Negative.   Respiratory: Negative.   Cardiovascular: Negative.   Gastrointestinal: Negative.   Endocrine: Negative.   Genitourinary: Negative.   Skin: Positive for wound.  Psychiatric/Behavioral: Negative.  Negative for agitation.       Objective:   Physical Exam Vitals signs and nursing note reviewed.  Constitutional:      Appearance: Normal appearance.  Cardiovascular:     Rate and Rhythm: Normal rate.  Neurological:     General: No focal deficit present.     Mental Status: She is alert.  Psychiatric:        Mood and Affect: Mood normal.        Judgment: Judgment normal.        Assessment & Plan:  Dog bite, initial encounter  Wound of left lower extremity, initial encounter Xeroform was applied.  She should change this every other day. Don't get it wet yet. Keep the splint in place.  Follow up in one week.

## 2019-01-24 ENCOUNTER — Encounter: Payer: Self-pay | Admitting: Plastic Surgery

## 2019-01-24 ENCOUNTER — Ambulatory Visit (INDEPENDENT_AMBULATORY_CARE_PROVIDER_SITE_OTHER): Payer: Self-pay | Admitting: Plastic Surgery

## 2019-01-24 VITALS — BP 115/85 | HR 96 | Temp 98.0°F | Ht 64.0 in | Wt 219.0 lb

## 2019-01-24 DIAGNOSIS — S81802A Unspecified open wound, left lower leg, initial encounter: Secondary | ICD-10-CM

## 2019-01-24 NOTE — Progress Notes (Signed)
The patient is a 48 year old female here for follow-up on her left leg wound from a dog bite.  She underwent a skin graft.  It appears that there is 100% take on the skin graft.  The donor site I removed the Xeroform.  It is healed.  No sign of infection in either place.  Recommend Vaseline to both areas.  I would still keep the leg covered for another week.  Follow-up in 1 month or as needed.

## 2019-02-04 ENCOUNTER — Encounter: Payer: Self-pay | Admitting: Critical Care Medicine

## 2019-02-04 ENCOUNTER — Ambulatory Visit: Payer: Self-pay | Attending: Critical Care Medicine | Admitting: Critical Care Medicine

## 2019-02-04 VITALS — BP 117/83 | HR 92 | Temp 98.3°F | Resp 18 | Ht 64.0 in | Wt 222.0 lb

## 2019-02-04 DIAGNOSIS — G43009 Migraine without aura, not intractable, without status migrainosus: Secondary | ICD-10-CM

## 2019-02-04 DIAGNOSIS — F329 Major depressive disorder, single episode, unspecified: Secondary | ICD-10-CM

## 2019-02-04 DIAGNOSIS — E890 Postprocedural hypothyroidism: Secondary | ICD-10-CM

## 2019-02-04 DIAGNOSIS — Z9884 Bariatric surgery status: Secondary | ICD-10-CM | POA: Insufficient documentation

## 2019-02-04 DIAGNOSIS — G2581 Restless legs syndrome: Secondary | ICD-10-CM

## 2019-02-04 DIAGNOSIS — F32A Depression, unspecified: Secondary | ICD-10-CM

## 2019-02-04 DIAGNOSIS — F419 Anxiety disorder, unspecified: Secondary | ICD-10-CM

## 2019-02-04 DIAGNOSIS — Z789 Other specified health status: Secondary | ICD-10-CM

## 2019-02-04 DIAGNOSIS — K909 Intestinal malabsorption, unspecified: Secondary | ICD-10-CM

## 2019-02-04 MED ORDER — CALCITRIOL 0.5 MCG PO CAPS: 2.0000 ug | ORAL_CAPSULE | Freq: Two times a day (BID) | ORAL | 1 refills | Status: AC

## 2019-02-04 MED ORDER — TOPIRAMATE 100 MG PO TABS: 100.0000 mg | ORAL_TABLET | Freq: Every evening | ORAL | 3 refills | Status: DC | PRN

## 2019-02-04 MED ORDER — VITAMIN D (ERGOCALCIFEROL) 1.25 MG (50000 UNIT) PO CAPS: 50000.0000 [IU] | ORAL_CAPSULE | ORAL | 2 refills | Status: AC

## 2019-02-04 MED ORDER — MAGNESIUM OXIDE 400 MG PO TABS: 400.0000 mg | ORAL_TABLET | ORAL | 3 refills | Status: AC

## 2019-02-04 MED ORDER — METHOCARBAMOL 500 MG PO TABS: 1000.0000 mg | ORAL_TABLET | Freq: Three times a day (TID) | ORAL | 0 refills | Status: DC | PRN

## 2019-02-04 MED ORDER — GABAPENTIN 300 MG PO CAPS: 300.0000 mg | ORAL_CAPSULE | Freq: Three times a day (TID) | ORAL | 1 refills | Status: DC

## 2019-02-04 MED ORDER — SERTRALINE HCL 100 MG PO TABS: 100.0000 mg | ORAL_TABLET | Freq: Every day | ORAL | 3 refills | Status: DC

## 2019-02-04 MED ORDER — LEVOTHYROXINE SODIUM 100 MCG PO TABS: 250.0000 ug | ORAL_TABLET | Freq: Every day | ORAL | 2 refills | Status: DC

## 2019-02-04 MED FILL — METHOCARBAMOL 500 MG TABS: 500 | 5 days supply | Qty: 30 | Fill #0

## 2019-02-04 MED FILL — TOPIRAMATE 100 MG TABS: 100 | 30 days supply | Qty: 30 | Fill #0

## 2019-02-04 MED FILL — CALCITRIOL 0.5 MCG CAPS: 0.5 | 30 days supply | Qty: 240 | Fill #0

## 2019-02-04 MED FILL — GABAPENTIN 300 MG CAPSULE: 300 | 30 days supply | Qty: 90 | Fill #0

## 2019-02-04 MED FILL — MAGNESIUM OXIDE 400 MG TAB: 400 (240 MG | 30 days supply | Qty: 30 | Fill #0

## 2019-02-04 MED FILL — VIT D2 1.25 MG (50,000 UNIT: 1.25 MG | 28 days supply | Qty: 4 | Fill #0

## 2019-02-04 MED FILL — SERTRALINE HCL 100 MG TAB: 100 | 30 days supply | Qty: 30 | Fill #0

## 2019-02-04 MED FILL — LEVOTHYROXINE 100 MCG TAB: 100 | 30 days supply | Qty: 75 | Fill #0

## 2019-02-04 NOTE — Assessment & Plan Note (Signed)
The patient clinically has restless leg syndrome with sleep disruption  Once she obtains an orange card we will consider a sleep study in the meantime we will refill the gabapentin

## 2019-02-04 NOTE — Patient Instructions (Addendum)
All of your medications were sent to our pharmacy for refills  Labs today include a complete metabolic panel, blood count, thyroid panel, magnesium, and iron level.  A follow-up visit to establish with primary care will be made in 6 weeks  Please complete financial application so we can consider a sleep study at a future date  An appointment with our licensed clinical social worker will be made, Jenel Lucks to assist you with your anxiety and depression

## 2019-02-04 NOTE — Assessment & Plan Note (Signed)
The patient is status post thyroidectomy and is on Synthroid 250 mcg daily  We will recheck thyroid panel and refill the Synthroid at this time

## 2019-02-04 NOTE — Assessment & Plan Note (Signed)
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

## 2019-02-04 NOTE — Assessment & Plan Note (Signed)
History of migraine headaches controlled with as needed Topamax  We will refill the Topamax 100 mg daily as needed

## 2019-02-04 NOTE — Progress Notes (Signed)
Subjective:    Patient ID: Ann Randall, female    DOB: 19-Nov-1971, 48 y.o.   MRN: [id number]   This is a 48 year old female who had a severe injury of the left lower extremity due to a dog bite in December 2019.  She had complete avulsion of the skin and tissue of her left calf.  She was admitted on the eighth discharged on 07 November 2018.  She was seen by plastic surgery and underwent wound VAC placement. She had subsequent repeat debridements and work performed on her left lower extremity.  She is at the point now where her skin graft is taken and wound care has been discontinued.  The patient comes in today post hospital to establish for primary care.  She has a history of hypothyroidism secondary to surgical resection of the thyroid gland.  This occurred at age 28.  Patient also had gastric bypass surgery x2 in the past.  The second surgery was including a Roux-en-Y procedure at [facility].  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  The patient has significant cramping of the hands and other muscular areas at times if her calcium level falls too low with associated tetany.  The patient is on high-dose Calcitrol and calcium supplementation along with iron and vitamin D supplementation.  Patient also has a history of anxiety and depression.  Patient is on chronic Zoloft.  She has significant restless legs as well but is never had studies for this.  She takes gabapentin for the restless legs.  Patient has a sleep disturbance due to the restless leg syndrome.   The patient has no history of diabetes, hypertension.  She is a non-smoker.  Patient does have chronic migraines.  Note the patient is status post cholecystectomy and also total abdominal hysterectomy with bilateral salpingo-oophorectomy  Past Medical History:  Diagnosis Date  . Anemia   . Depression   . Hypothyroidism    no thyroid gland     History reviewed. No pertinent  family history.   Social History   Socioeconomic History  . Marital status: Divorced    Spouse name: Not on file  . Number of children: Not on file  . Years of education: Not on file  . Highest education level: Not on file  Occupational History  . Not on file  Social Needs  . Financial resource strain: Not on file  . Food insecurity:    Worry: Not on file    Inability: Not on file  . Transportation needs:    Medical: Not on file    Non-medical: Not on file  Tobacco Use  . Smoking status: Never Smoker  . Smokeless tobacco: Never Used  Substance and Sexual Activity  . Alcohol use: Yes    Comment: Wine occasional  . Drug use: Never  . Sexual activity: Not Currently  Lifestyle  . Physical activity:    Days per week: Not on file    Minutes per session: Not on file  . Stress: Not on file  Relationships  . Social connections:    Talks on phone: Not on file    Gets together: Not on file    Attends religious service: Not on file    Active member of club or organization: Not on file    Attends meetings of clubs or organizations: Not on file    Relationship status: Not on file  . Intimate partner violence:    Fear of current  or ex partner: Not on file    Emotionally abused: Not on file    Physically abused: Not on file    Forced sexual activity: Not on file  Other Topics Concern  . Not on file  Social History Narrative  . Not on file     No Known Allergies   Outpatient Medications Prior to Visit  Medication Sig Dispense Refill  . acetaminophen (TYLENOL) 500 MG tablet Take 2 tablets (1,000 mg total) by mouth every 8 (eight) hours as needed. 30 tablet 0  . Calcium-Magnesium-Vitamin D (CALCIUM 1200+D3 PO) Take 4 tablets by mouth 2 (two) times daily.    . naproxen sodium (ALEVE) 220 MG tablet Take 220-660 mg by mouth daily as needed (pain).    . calcitRIOL (ROCALTROL) 0.5 MCG capsule Take 2 mcg by mouth 2 (two) times daily.     Marland Kitchen gabapentin (NEURONTIN) 300 MG capsule Take  1 capsule (300 mg total) by mouth 3 (three) times daily. 60 capsule 0  . levothyroxine (SYNTHROID, LEVOTHROID) 100 MCG tablet Take 250 mcg by mouth daily before breakfast.     . magnesium oxide (MAG-OX) 400 MG tablet Take 400 mg by mouth every morning.    . methocarbamol (ROBAXIN) 500 MG tablet Take 2 tablets (1,000 mg total) by mouth every 8 (eight) hours as needed for muscle spasms. 30 tablet 0  . sertraline (ZOLOFT) 50 MG tablet Take 100 mg by mouth at bedtime.     . topiramate (TOPAMAX) 100 MG tablet Take 100 mg by mouth at bedtime as needed (migraine).    . Vitamin D, Ergocalciferol, (DRISDOL) 1.25 MG (50000 UT) CAPS capsule Take 50,000 Units by mouth every Sunday.     No facility-administered medications prior to visit.       Review of Systems  Constitutional: Negative.   HENT: Negative.   Eyes: Negative.   Respiratory: Negative.   Cardiovascular: Negative.   Gastrointestinal: Positive for diarrhea. Negative for blood in stool, nausea and vomiting.  Endocrine: Negative.   Genitourinary: Negative.   Musculoskeletal: Positive for myalgias.  Neurological: Positive for tremors.       Muscle twitching  Hematological: Negative.   Psychiatric/Behavioral: Negative for self-injury and suicidal ideas. The patient is nervous/anxious.        Objective:   Physical Exam Vitals:   02/04/19 0842  BP: 117/83  Pulse: 92  Resp: 18  Temp: 98.3 F (36.8 C)  TempSrc: Oral  SpO2: 95%  Weight: 222 lb (100.7 kg)  Height: 5\' 4"  (1.626 m)    Gen: Pleasant, well-nourished, in no distress,  normal affect  ENT: No lesions,  mouth clear,  oropharynx clear, no postnasal drip  Neck: No JVD, no TMG, no carotid bruits  Lungs: No use of accessory muscles, no dullness to percussion, clear without rales or rhonchi  Cardiovascular: RRR, heart sounds normal, no murmur or gallops, no peripheral edema  Abdomen: soft and NT, no HSM,  BS normal  Musculoskeletal: No deformities, no cyanosis or  clubbing  Neuro: alert, non focal  Skin: Warm, no lesions or rashes, wound over left lower extremity is completely healed  All labs from hospitalization to December were reviewed and were unremarkable  Calcium however was slightly low and no magnesium level was sent but note hemoglobin was normal thyroid function also was normal      Assessment & Plan:  I personally reviewed all images and lab data in the [facility] system as well as any outside material available during this office  visit and agree with the  radiology impressions.   Migraine without aura and without status migrainosus, not intractable History of migraine headaches controlled with as needed Topamax  We will refill the Topamax 100 mg daily as needed  Iron malabsorption History of apparent malabsorption of iron, calcium, and magnesium in the diet  This likely is related to the patient's previous gastric bypass surgery and Roux-en-Y procedures  It would be helpful for this patient to be evaluated by gastroenterology and endocrinology once she can obtain insurance status  In the interim we will refill the oral Calcitrol, magnesium oxide, and she will continue over-the-counter vitamin D, calcium, magnesium supplementation  Postoperative hypothyroidism The patient is status post thyroidectomy and is on Synthroid 250 mcg daily  We will recheck thyroid panel and refill the Synthroid at this time  History of dog bite The left lower extremity wound has improved and now resolved     Hypocalcemia History of hypocalcemia secondary to malabsorption  Ro Calcitrol and calcium supplementation was renewed  Hypomagnesemia History of hypomagnesemia will continue supplementation and recheck levels  Anxiety and depression Significant anxiety depression with a very high score on gad 7 and PHQ 9  We will refer to our clinical social worker for further evaluation and refill the sertraline  Restless leg syndrome The patient  clinically has restless leg syndrome with sleep disruption  Once she obtains an orange card we will consider a sleep study in the meantime we will refill the gabapentin   Ann Randall was seen today for hospitalization follow-up.  Diagnoses and all orders for this visit:  Postoperative hypothyroidism -     Thyroid Profile  Hypocalcemia -     Comprehensive metabolic panel  Iron malabsorption -     CBC with Differential/Platelet; Future -     Iron -     CBC with Differential/Platelet  S/P gastric bypass  Restless leg syndrome  Migraine without aura and without status migrainosus, not intractable  Hypomagnesemia -     Magnesium; Future -     Magnesium  Anxiety and depression  History of dog bite  Other orders -     Vitamin D, Ergocalciferol, (DRISDOL) 1.25 MG (50000 UT) CAPS capsule; Take 1 capsule (50,000 Units total) by mouth every Sunday. -     topiramate (TOPAMAX) 100 MG tablet; Take 1 tablet (100 mg total) by mouth at bedtime as needed (migraine). -     sertraline (ZOLOFT) 100 MG tablet; Take 1 tablet (100 mg total) by mouth at bedtime. -     methocarbamol (ROBAXIN) 500 MG tablet; Take 2 tablets (1,000 mg total) by mouth every 8 (eight) hours as needed for muscle spasms. -     magnesium oxide (MAG-OX) 400 MG tablet; Take 1 tablet (400 mg total) by mouth every morning. -     levothyroxine (SYNTHROID, LEVOTHROID) 100 MCG tablet; Take 2.5 tablets (250 mcg total) by mouth daily before breakfast. -     gabapentin (NEURONTIN) 300 MG capsule; Take 1 capsule (300 mg total) by mouth 3 (three) times daily. -     calcitRIOL (ROCALTROL) 0.5 MCG capsule; Take 4 capsules (2 mcg total) by mouth 2 (two) times daily.

## 2019-02-04 NOTE — Assessment & Plan Note (Signed)
Significant anxiety depression with a very high score on gad 7 and PHQ 9  We will refer to our clinical social worker for further evaluation and refill the sertraline

## 2019-02-04 NOTE — Assessment & Plan Note (Signed)
History of hypocalcemia secondary to malabsorption  Ro Calcitrol and calcium supplementation was renewed

## 2019-02-04 NOTE — Assessment & Plan Note (Signed)
The left lower extremity wound has improved and now resolved

## 2019-02-04 NOTE — Assessment & Plan Note (Signed)
History of hypomagnesemia will continue supplementation and recheck levels

## 2019-02-05 ENCOUNTER — Telehealth: Payer: Self-pay | Admitting: Emergency Medicine

## 2019-02-05 LAB — MAGNESIUM: Magnesium: 2 mg/dL (ref 1.6–2.3)

## 2019-02-05 LAB — CBC WITH DIFFERENTIAL/PLATELET
Basophils Absolute: 0 10*3/uL (ref 0.0–0.2)
Basos: 1 %
EOS (ABSOLUTE): 0.1 10*3/uL (ref 0.0–0.4)
EOS: 1 %
Hematocrit: 41 % (ref 34.0–46.6)
Hemoglobin: 14.3 g/dL (ref 11.1–15.9)
Immature Grans (Abs): 0 10*3/uL (ref 0.0–0.1)
Immature Granulocytes: 0 %
Lymphocytes Absolute: 1.8 10*3/uL (ref 0.7–3.1)
Lymphs: 24 %
MCH: 35.5 pg — ABNORMAL HIGH (ref 26.6–33.0)
MCHC: 34.9 g/dL (ref 31.5–35.7)
MCV: 102 fL — ABNORMAL HIGH (ref 79–97)
MONOCYTES: 6 %
Monocytes Absolute: 0.4 10*3/uL (ref 0.1–0.9)
Neutrophils Absolute: 5.2 10*3/uL (ref 1.4–7.0)
Neutrophils: 68 %
Platelets: 201 10*3/uL (ref 150–450)
RBC: 4.03 x10E6/uL (ref 3.77–5.28)
RDW: 16.2 % — ABNORMAL HIGH (ref 11.7–15.4)
WBC: 7.5 10*3/uL (ref 3.4–10.8)

## 2019-02-05 LAB — COMPREHENSIVE METABOLIC PANEL
A/G RATIO: 1.4 (ref 1.2–2.2)
ALT: 23 IU/L (ref 0–32)
AST: 26 IU/L (ref 0–40)
Albumin: 4.2 g/dL (ref 3.8–4.8)
Alkaline Phosphatase: 114 IU/L (ref 39–117)
BUN/Creatinine Ratio: 27 — ABNORMAL HIGH (ref 9–23)
BUN: 19 mg/dL (ref 6–24)
Bilirubin Total: 0.5 mg/dL (ref 0.0–1.2)
CALCIUM: 6.9 mg/dL — AB (ref 8.7–10.2)
CO2: 24 mmol/L (ref 20–29)
Chloride: 98 mmol/L (ref 96–106)
Creatinine, Ser: 0.7 mg/dL (ref 0.57–1.00)
GFR calc Af Amer: 118 mL/min/{1.73_m2} (ref >59)
GFR, EST NON AFRICAN AMERICAN: 102 mL/min/{1.73_m2} (ref >59)
Globulin, Total: 3.1 g/dL (ref 1.5–4.5)
Glucose: 102 mg/dL — ABNORMAL HIGH (ref 65–99)
Potassium: 4.1 mmol/L (ref 3.5–5.2)
Sodium: 141 mmol/L (ref 134–144)
Total Protein: 7.3 g/dL (ref 6.0–8.5)

## 2019-02-05 LAB — THYROID PANEL
Free Thyroxine Index: 2.3 (ref 1.2–4.9)
T3 Uptake Ratio: 29 % (ref 24–39)
T4 TOTAL: 7.9 ug/dL (ref 4.5–12.0)

## 2019-02-05 LAB — IRON: Iron: 111 ug/dL (ref 27–159)

## 2019-02-05 NOTE — Telephone Encounter (Signed)
[facility] called with a critical lab value.  Name of patient and MRN verified with technician.  Technician stated thatpatients :  Calcium = 6.9

## 2019-03-19 ENCOUNTER — Other Ambulatory Visit: Payer: Self-pay

## 2019-03-19 ENCOUNTER — Encounter: Payer: Self-pay | Admitting: Nurse Practitioner

## 2019-03-19 ENCOUNTER — Ambulatory Visit: Payer: Self-pay | Attending: Nurse Practitioner | Admitting: Nurse Practitioner

## 2019-03-19 DIAGNOSIS — F419 Anxiety disorder, unspecified: Secondary | ICD-10-CM

## 2019-03-19 DIAGNOSIS — E890 Postprocedural hypothyroidism: Secondary | ICD-10-CM

## 2019-03-19 DIAGNOSIS — G43009 Migraine without aura, not intractable, without status migrainosus: Secondary | ICD-10-CM

## 2019-03-19 DIAGNOSIS — F32A Depression, unspecified: Secondary | ICD-10-CM

## 2019-03-19 DIAGNOSIS — G2581 Restless legs syndrome: Secondary | ICD-10-CM

## 2019-03-19 DIAGNOSIS — F329 Major depressive disorder, single episode, unspecified: Secondary | ICD-10-CM

## 2019-03-19 MED ORDER — SERTRALINE HCL 100 MG PO TABS: 200.0000 mg | ORAL_TABLET | Freq: Every day | ORAL | 3 refills | Status: AC

## 2019-03-19 MED ORDER — METHOCARBAMOL 500 MG PO TABS: 1000.0000 mg | ORAL_TABLET | Freq: Three times a day (TID) | ORAL | 0 refills | Status: AC | PRN

## 2019-03-19 MED ORDER — TOPIRAMATE 100 MG PO TABS: 100.0000 mg | ORAL_TABLET | Freq: Every evening | ORAL | 3 refills | Status: DC | PRN

## 2019-03-19 MED ORDER — LEVOTHYROXINE SODIUM 100 MCG PO TABS: 250.0000 ug | ORAL_TABLET | Freq: Every day | ORAL | 2 refills | Status: DC

## 2019-03-19 MED ORDER — GABAPENTIN 300 MG PO CAPS: 300.0000 mg | ORAL_CAPSULE | Freq: Three times a day (TID) | ORAL | 1 refills | Status: AC

## 2019-03-19 MED FILL — GABAPENTIN 300 MG CAPSULE: 300 | 30 days supply | Qty: 90 | Fill #0

## 2019-03-19 MED FILL — SERTRALINE HCL 100 MG TAB: 100 | 30 days supply | Qty: 60 | Fill #0

## 2019-03-19 MED FILL — TOPIRAMATE 100 MG TABS: 100 | 30 days supply | Qty: 30 | Fill #0

## 2019-03-19 MED FILL — METHOCARBAMOL 500 MG TABS: 500 | 10 days supply | Qty: 30 | Fill #0

## 2019-03-19 MED FILL — LEVOTHYROXINE 100 MCG TAB: 100 | 30 days supply | Qty: 75 | Fill #0

## 2019-03-19 NOTE — Progress Notes (Signed)
Virtual Visit via Telephone Note  I connected with Ann Randall on 03/19/19  at  11:24 AM EDT  EDT by telephone and verified that I am speaking with the correct person using two identifiers.   Consent I discussed the limitations, risks, security and privacy concerns of performing an evaluation and management service by telephone and the availability of in person appointments. I also discussed with the patient that there may be a patient responsible charge related to this service. The patient expressed understanding and agreed to proceed.   Location of Patient:  Private Residence   Location of Provider: [facility]    Persons participating in Telemedicine visit: Bertram Denver FNP-BC YY [place] CMA Ann Randall    History of Present Illness: Telemedicine visit: Establish care. She has a history of Anxiety and Depression, Migraine headaches, iron malabsorption, gastric bypass surgery and Roux-en-Y procedures, s/p thyroidectomy and postoperative hypothyroidism, restless leg syndrome and history of dog bite of LLE (now resolved).  Anxiety and Depression Does not feel zoloft 100 mg completely relieves her symptoms of depression and mood lability. Will increase the dosage today.  Depression screen [facility] 2/9 03/19/2019 02/04/2019  Decreased Interest 3 1  Down, Depressed, Hopeless 3 3  PHQ - 2 Score 6 4  Altered sleeping 3 3  Tired, decreased energy 3 3  Change in appetite 3 2  Feeling bad or failure about yourself  2 3  Trouble concentrating 3 3  Moving slowly or fidgety/restless 0 3  Suicidal thoughts 0 0  PHQ-9 Score 20 21    Postoperative Hypothyroidism She is S/P thyroidectomy. Endorses medication compliance taking Synthroid 250mg  daily. She denies fatigue, weight changes, heat/cold intolerance, bowel/skin changes or CVS symptoms.    Observations/Objective: Awake, alert and oriented x 3  Review of Systems  Constitutional: Negative for fever,  malaise/fatigue and weight loss.  HENT: Negative.  Negative for nosebleeds.   Eyes: Negative.  Negative for blurred vision, double vision and photophobia.  Respiratory: Negative.  Negative for cough and shortness of breath.   Cardiovascular: Negative.  Negative for chest pain, palpitations and leg swelling.  Gastrointestinal: Negative.  Negative for heartburn, nausea and vomiting.  Musculoskeletal: Negative for myalgias.       RLS  Neurological: Positive for headaches. Negative for dizziness, focal weakness and seizures.  Psychiatric/Behavioral: Positive for depression. Negative for suicidal ideas. The patient is nervous/anxious.     Assessment and Plan: Diagnoses and all orders for this visit:  Postoperative hypothyroidism -     levothyroxine (SYNTHROID) 100 MCG tablet; Take 2.5 tablets (250 mcg total) by mouth daily before breakfast. -     TSH; Future  Anxiety and depression -     sertraline (ZOLOFT) 100 MG tablet; Take 2 tablets (200 mg total) by mouth at bedtime for 30 days. -     gabapentin (NEURONTIN) 300 MG capsule; Take 1 capsule (300 mg total) by mouth 3 (three) times daily. Denies any current thoughts of self harm.   Migraine without aura and without status migrainosus, not intractable -     topiramate (TOPAMAX) 100 MG tablet; Take 1 tablet (100 mg total) by mouth at bedtime as needed (migraine). Chronic and stable.   Restless leg syndrome -     methocarbamol (ROBAXIN) 500 MG tablet; Take 2 tablets (1,000 mg total) by mouth every 8 (eight) hours as needed for muscle spasms.  Chronic. Wakes her up at night. Will need sleep study. Continue robaxin and gabapentin as prescribed.  Follow Up Instructions Return in about 3 weeks (around 04/09/2019) for TSH.     I discussed the assessment and treatment plan with the patient. The patient was provided an opportunity to ask questions and all were answered. The patient agreed with the plan and demonstrated an understanding of the  instructions.   The patient was advised to call back or seek an in-person evaluation if the symptoms worsen or if the condition fails to improve as anticipated.  I provided 18 minutes of non-face-to-face time during this encounter including median intraservice time, reviewing previous notes, labs, imaging, medications and explaining diagnosis and management.  Claiborne RiggZelda W Fleming, FNP-BC

## 2019-03-22 ENCOUNTER — Encounter: Payer: Self-pay | Admitting: Nurse Practitioner

## 2019-04-09 ENCOUNTER — Other Ambulatory Visit: Payer: Self-pay

## 2019-05-13 ENCOUNTER — Telehealth: Payer: Self-pay | Admitting: General Practice

## 2019-05-13 NOTE — Telephone Encounter (Signed)
Patient called stating she would like to get her results. Please follow up °

## 2019-05-13 NOTE — Telephone Encounter (Signed)
Patients call returned.  Patient identified by name and date of birth.  Patient states she was suppose to be called back to reschedule her thyroid level lab but was never contacted.  Patient has been out of her thyroid medication for 2 1/2 weeks and patient is concerned for a heart arthymia.  Patient was made an appointment for tomorrow for labs.  Patient also advised that the information on the lack of patients thyroid medication would be passed on to provider.  Patient acknowledged understanding of advice.

## 2019-05-14 ENCOUNTER — Other Ambulatory Visit: Payer: Self-pay

## 2019-05-14 ENCOUNTER — Ambulatory Visit: Payer: Self-pay | Attending: Family Medicine

## 2019-05-14 DIAGNOSIS — E890 Postprocedural hypothyroidism: Secondary | ICD-10-CM

## 2019-05-14 NOTE — Telephone Encounter (Signed)
We do not reschedule appointments for labs. That is the patient's responsibility if she calls to cancel. She also has refills available until July.

## 2019-05-15 LAB — TSH: TSH: 2.76 u[IU]/mL (ref 0.450–4.500)

## 2019-05-16 NOTE — Telephone Encounter (Signed)
Patient have an upcoming appt. With 05/19/2019 to discuss regarding labs and medication.   Pt. Was inform her appt. Will be a televisit.

## 2019-05-19 ENCOUNTER — Ambulatory Visit: Payer: Self-pay | Attending: Nurse Practitioner | Admitting: Nurse Practitioner

## 2019-05-19 ENCOUNTER — Other Ambulatory Visit: Payer: Self-pay

## 2019-05-19 ENCOUNTER — Encounter: Payer: Self-pay | Admitting: Nurse Practitioner

## 2019-05-19 DIAGNOSIS — E890 Postprocedural hypothyroidism: Secondary | ICD-10-CM

## 2019-05-19 DIAGNOSIS — F32A Depression, unspecified: Secondary | ICD-10-CM

## 2019-05-19 DIAGNOSIS — G43009 Migraine without aura, not intractable, without status migrainosus: Secondary | ICD-10-CM

## 2019-05-19 DIAGNOSIS — E201 Pseudohypoparathyroidism: Secondary | ICD-10-CM

## 2019-05-19 DIAGNOSIS — F419 Anxiety disorder, unspecified: Secondary | ICD-10-CM

## 2019-05-19 DIAGNOSIS — F4310 Post-traumatic stress disorder, unspecified: Secondary | ICD-10-CM

## 2019-05-19 DIAGNOSIS — F329 Major depressive disorder, single episode, unspecified: Secondary | ICD-10-CM

## 2019-05-19 MED ORDER — TOPIRAMATE 100 MG PO TABS: 100.0000 mg | ORAL_TABLET | Freq: Every day | ORAL | 3 refills | Status: AC

## 2019-05-19 MED ORDER — LEVOTHYROXINE SODIUM 100 MCG PO TABS: 250.0000 ug | ORAL_TABLET | Freq: Every day | ORAL | 2 refills | Status: AC

## 2019-05-19 MED ORDER — QUETIAPINE FUMARATE 25 MG PO TABS: 25.0000 mg | ORAL_TABLET | Freq: Every day | ORAL | 0 refills | Status: DC

## 2019-05-19 MED ORDER — TOPIRAMATE 100 MG PO TABS: 100.0000 mg | ORAL_TABLET | Freq: Every evening | ORAL | 3 refills | Status: DC | PRN

## 2019-05-19 MED ORDER — QUETIAPINE FUMARATE 25 MG PO TABS: 25.0000 mg | ORAL_TABLET | Freq: Every day | ORAL | 0 refills | Status: AC

## 2019-05-19 MED ORDER — LEVOTHYROXINE SODIUM 100 MCG PO TABS: 250.0000 ug | ORAL_TABLET | Freq: Every day | ORAL | 2 refills | Status: DC

## 2019-05-19 MED FILL — QUETIAPINE FUMARATE 25 MG T: 25 | 15 days supply | Qty: 30 | Fill #0

## 2019-05-19 MED FILL — TOPIRAMATE 100 MG TABLET: 100 | 30 days supply | Qty: 30 | Fill #0

## 2019-05-19 MED FILL — LEVOTHYROXINE 100 MCG TAB: 100 | 30 days supply | Qty: 75 | Fill #0

## 2019-05-19 NOTE — Progress Notes (Signed)
Virtual Visit via Telephone Note Due to national recommendations of social distancing due to COVID 19, telehealth visit is felt to be most appropriate for this patient at this time.  I discussed the limitations, risks, security and privacy concerns of performing an evaluation and management service by telephone and the availability of in person appointments. I also discussed with the patient that there may be a patient responsible charge related to this service. The patient expressed understanding and agreed to proceed.    I connected with Ann Randall on 05/20/19  at   9:30 AM EDT  EDT by telephone and verified that I am speaking with the correct person using two identifiers.   Consent I discussed the limitations, risks, security and privacy concerns of performing an evaluation and management service by telephone and the availability of in person appointments. I also discussed with the patient that there may be a patient responsible charge related to this service. The patient expressed understanding and agreed to proceed.   Location of Patient: Private Residence   [facility]    Persons participating in Telemedicine visit: Ann DenverZelda Fleming FNP-BC YY [place] CMA Ann MarshallKimberly Mullin    History of Present Illness: Telemedicine visit for: Follow up Depression.   has a past medical history of Anemia, Depression, Hypothyroidism, and Migraine. She has PTSD from an attack by a dog several months ago. The attack caused a severe leg wound which required leg debridement. Her zoloft was increased at her last office visit. Her PHQ9 score has not changed however her GAD score has improved. Will try seroquel as she feels her PTSD symptoms have increased. She denies any current thoughts of self harm.  GAD 7 : Generalized Anxiety Score 05/19/2019 03/19/2019 02/04/2019  Nervous, Anxious, on Edge 3 3 3   Control/stop worrying 3 3 2   Worry too much - different things 3  3 3   Trouble relaxing 3 3 3   Restless 3 3 3   Easily annoyed or irritable 3 3 3   Afraid - awful might happen 2 2 1   Total GAD 7 Score 20 20 18     [facility] 05/19/2019 03/19/2019 02/04/2019  Decreased Interest 0 3 1  Down, Depressed, Hopeless 3 3 3   PHQ - 2 Score 3 6 4   Altered sleeping 3 3 3   Tired, decreased energy 1 3 3   Change in appetite 2 3 2   Feeling bad or failure about yourself  2 2 3   Trouble concentrating 0 3 3  Moving slowly or fidgety/restless 0 0 3  Suicidal thoughts 0 0 0  PHQ-9 Score 11 20 21      Past Medical History:  Diagnosis Date  . Anemia   . Depression   . Hypothyroidism    no thyroid gland  . Migraine     Past Surgical History:  Procedure Laterality Date  . ABDOMINAL HYSTERECTOMY    . APPLICATION OF A-CELL OF EXTREMITY Left 01/09/2019   Procedure: APPLICATION OF A-CELL OF EXTREMITY, LEFT THIGH;  Surgeon: Peggye Formillingham, Claire S, DO;  Location: [facility];  Service: Plastics;  Laterality: Left;  . APPLICATION OF WOUND VAC Left 11/05/2018   Procedure: APPLICATION OF WOUND VAC;  Surgeon: Peggye Formillingham, Claire S, DO;  Location: [facility] OR;  Service: Plastics;  Laterality: Left;  . CHOLECYSTECTOMY    . GASTRIC BYPASS    . I&D EXTREMITY Left 11/03/2018   Procedure: DEBRIDEMENT OF LEFT LOWER EXTREMITY WOUND;  Surgeon: Berna Bueonnor, Chelsea A, MD;  Location:  [facility] OR;  Service: General;  Laterality: Left;  . I&D EXTREMITY Left 11/05/2018   Procedure: IRRIGATION AND DEBRIDEMENT OF LEG WITH A-CELL;  Surgeon: Peggye Formillingham, Claire S, DO;  Location: [facility] OR;  Service: Plastics;  Laterality: Left;  . IRRIGATION AND DEBRIDEMENT OF WOUND WITH SPLIT THICKNESS SKIN GRAFT Left 01/09/2019   Procedure: Left leg debridement with split thickness skin graft from upper leg to lower legwith ACell placement and VAC placement;  Surgeon: Peggye Formillingham, Claire S, DO;  Location: [facility];  Service: Plastics;  Laterality: Left;  . thyroidectomy    . TUBAL LIGATION       Family History  Problem Relation Age of Onset  . Diabetes Mother   . Cancer Father   . Thyroid disease Sister   . Diabetes Brother     Social History   Socioeconomic History  . Marital status: Divorced    Spouse name: Not on file  . Number of children: Not on file  . Years of education: Not on file  . Highest education level: Not on file  Occupational History  . Not on file  Social Needs  . Financial resource strain: Not on file  . Food insecurity    Worry: Not on file    Inability: Not on file  . Transportation needs    Medical: Not on file    Non-medical: Not on file  Tobacco Use  . Smoking status: Never Smoker  . Smokeless tobacco: Never Used  Substance and Sexual Activity  . Alcohol use: Yes    Comment: Wine occasional  . Drug use: Never  . Sexual activity: Yes    Birth control/protection: Surgical  Lifestyle  . Physical activity    Days per week: Not on file    Minutes per session: Not on file  . Stress: Not on file  Relationships  . Social Musicianconnections    Talks on phone: Not on file    Gets together: Not on file    Attends religious service: Not on file    Active member of club or organization: Not on file    Attends meetings of clubs or organizations: Not on file    Relationship status: Not on file  Other Topics Concern  . Not on file  Social History Narrative  . Not on file     Observations/Objective: Awake, alert and oriented x 3   Review of Systems  Constitutional: Negative for fever, malaise/fatigue and weight loss.  HENT: Negative.  Negative for nosebleeds.   Eyes: Negative.  Negative for blurred vision, double vision and photophobia.  Respiratory: Negative.  Negative for cough and shortness of breath.   Cardiovascular: Negative.  Negative for chest pain, palpitations and leg swelling.  Gastrointestinal: Negative.  Negative for heartburn, nausea and vomiting.  Musculoskeletal: Negative.  Negative for myalgias.  Neurological: Positive  for headaches. Negative for dizziness, focal weakness and seizures.  Psychiatric/Behavioral: Positive for depression. Negative for suicidal ideas. The patient is nervous/anxious and has insomnia.     Assessment and Plan: Cala BradfordKimberly was evaluated today for follow-up.  Diagnoses and all orders for this visit:  Anxiety and depression -     QUEtiapine (SEROQUEL) 25 MG tablet; Take 1 tablet (25 mg total) by mouth at bedtime for 30 days. May increase to 2 tablets (50 mg) by mouth after one week if needed.  PTSD (post-traumatic stress disorder) -     QUEtiapine (SEROQUEL) 25 MG tablet; Take 1 tablet (25 mg total) by mouth at bedtime  for 30 days. May increase to 2 tablets (50 mg) by mouth after one week if needed.  Migraine without aura and without status migrainosus, not intractable -     topiramate (TOPAMAX) 100 MG tablet; Take 1 tablet (100 mg total) by mouth daily for 30 days. Do not take at bedtime  Postoperative hypothyroidism -     levothyroxine (SYNTHROID) 100 MCG tablet; Take 2.5 tablets (250 mcg total) by mouth daily before breakfast. Patient will pick up on 05-20-2019 Lab Results  Component Value Date   TSH 2.760 05/14/2019  Thyroid level stable at this time. Patient denies change in energy level, diarrhea, heat / cold intolerance, palpitations and weight changes. Symptoms have stabilized.  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      Follow Up Instructions Return in about 4 weeks (around 06/16/2019) for seroquel.     I discussed the assessment and treatment plan with the patient. The patient was provided an opportunity to ask questions and all were answered. The patient agreed with the plan and demonstrated an understanding of the instructions.   The patient was advised to call back or seek an in-person evaluation if the symptoms worsen or if the condition fails to improve as anticipated.  I provided  24 minutes of non-face-to-face time during this encounter including median intraservice time, reviewing previous notes, labs, imaging, medications and explaining diagnosis and management.  Gildardo Pounds, FNP-BC

## 2019-05-20 ENCOUNTER — Other Ambulatory Visit: Payer: Self-pay

## 2019-05-20 ENCOUNTER — Encounter: Payer: Self-pay | Admitting: Nurse Practitioner

## 2019-05-20 ENCOUNTER — Other Ambulatory Visit: Payer: Self-pay | Admitting: Nurse Practitioner

## 2019-05-20 ENCOUNTER — Ambulatory Visit: Payer: Self-pay | Attending: Nurse Practitioner

## 2019-05-20 DIAGNOSIS — E201 Pseudohypoparathyroidism: Secondary | ICD-10-CM

## 2019-05-20 LAB — CALCIUM: Calcium: 7 mg/dL — ABNORMAL LOW (ref 8.7–10.2)

## 2019-05-20 NOTE — Progress Notes (Signed)
v

## 2019-05-22 NOTE — Progress Notes (Signed)
CMA spoke to patient and inform on lab results. Pt. Verified DOB. Pt. Understood.  Pt. Stated she is going to purchase chewable Calcium to take.  CMA inform patient to give [place] a call back on how much Calcium she will be taking, patient understood.

## 2019-05-22 NOTE — Progress Notes (Signed)
CMA inform patient to on lab results and medication.  Pt. Verified DOB. Pt. Understood.

## 2019-06-17 ENCOUNTER — Ambulatory Visit: Payer: Self-pay | Attending: Nurse Practitioner | Admitting: Nurse Practitioner

## 2019-06-17 ENCOUNTER — Other Ambulatory Visit: Payer: Self-pay

## 2019-07-30 MED FILL — LEVOTHYROXINE 100 MCG TAB: 100 | 30 days supply | Qty: 75 | Fill #1

## 2019-07-30 MED FILL — GABAPENTIN 300 MG CAPSULE: 300 | 30 days supply | Qty: 90 | Fill #1

## 2019-07-30 MED FILL — SERTRALINE HCL 100 MG TAB: 100 | 30 days supply | Qty: 60 | Fill #1

## 2019-09-24 ENCOUNTER — Telehealth: Payer: Self-pay | Admitting: *Deleted

## 2019-09-24 NOTE — Telephone Encounter (Addendum)
Received faxes from [facility] regarding [place].  Called and spoke with Josh ([employer] geologist) regarding the forms, and informed him that the patient was no longer using the VAC.  Informed him that the patient's last visit with [place] was (01/24/19), and the vac had been taking off before her visit with [place] on (01/17/19).  Josh Psychologist, forensic) informed me to disregard the fax sent since the vac had been discontinued.//AB/CMA

## 2019-10-01 NOTE — Telephone Encounter (Signed)
Received fax from [facility] for discharge orders for wound vac.  Form signed and faxed to [facility].  Confirmation received.//AB/CMA

## 2019-10-07 MED FILL — LEVOTHYROXINE 100 MCG TAB: 100 | 30 days supply | Qty: 75 | Fill #2

## 2020-04-08 IMAGING — DX DG TIBIA/FIBULA 2V*L*
2 series · 2 of 2 positions shown · non-contrast
Comparison: None.

CLINICAL DATA: Dog bite, laceration

EXAM:
LEFT TIBIA AND FIBULA - 2 VIEW

[tibia ap]
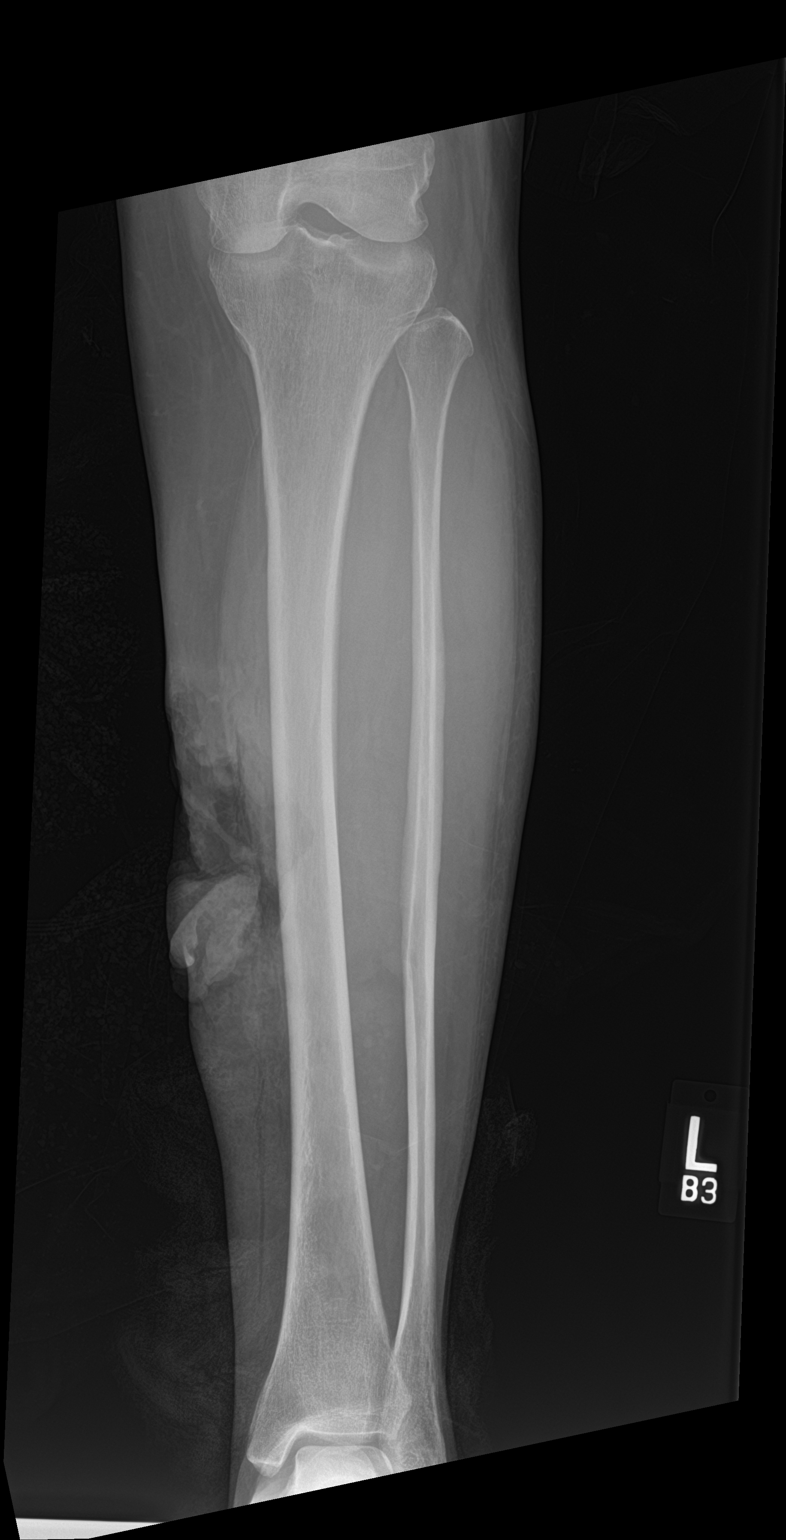

[tibia lat]
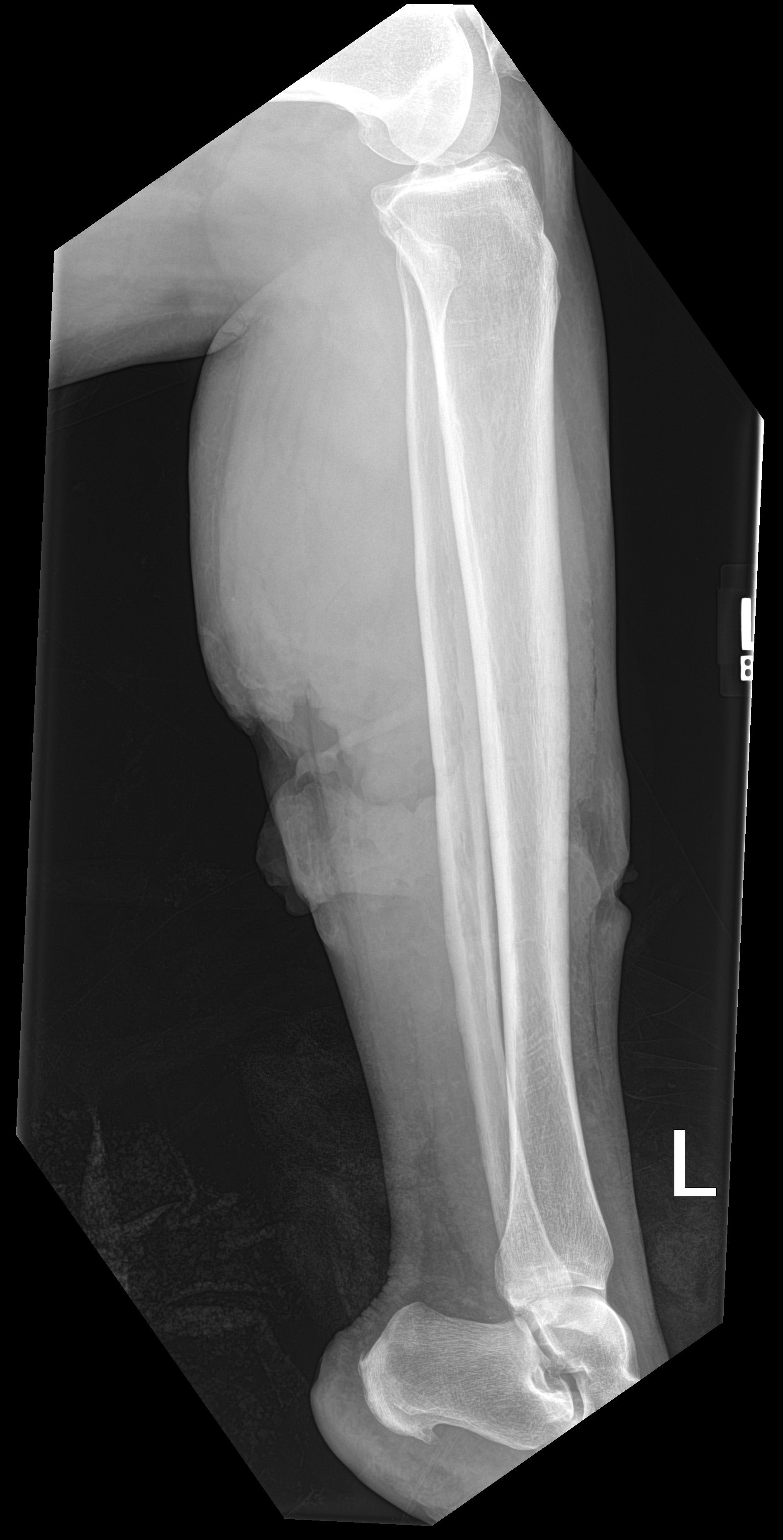

[2 of 2 positions shown; findings below may reference images not displayed]

FINDINGS: No fracture or dislocation is seen.

The joint spaces are preserved.

Large soft tissue laceration along the medial aspect of the mid
tibia/calf.

No radiopaque foreign body is seen.
IMPRESSION: Large soft tissue laceration along the medial aspect of the mid
tibia/calf.

No fracture, dislocation, or radiopaque foreign body is seen.

## 2020-04-10 IMAGING — DX DG ANKLE PORT 2V*L*
1 series · 3 of 3 positions shown · non-contrast
Comparison: Left tib-fib series 11/03/2018.

CLINICAL DATA: 47-year-old female status post attacked by dog, dog
bites. Left ankle pain.

EXAM:
PORTABLE LEFT ANKLE - 2 VIEW

[Series 1: ankle · 0.14mm/px · 3 of 3 slices shown]
[im 1/3]
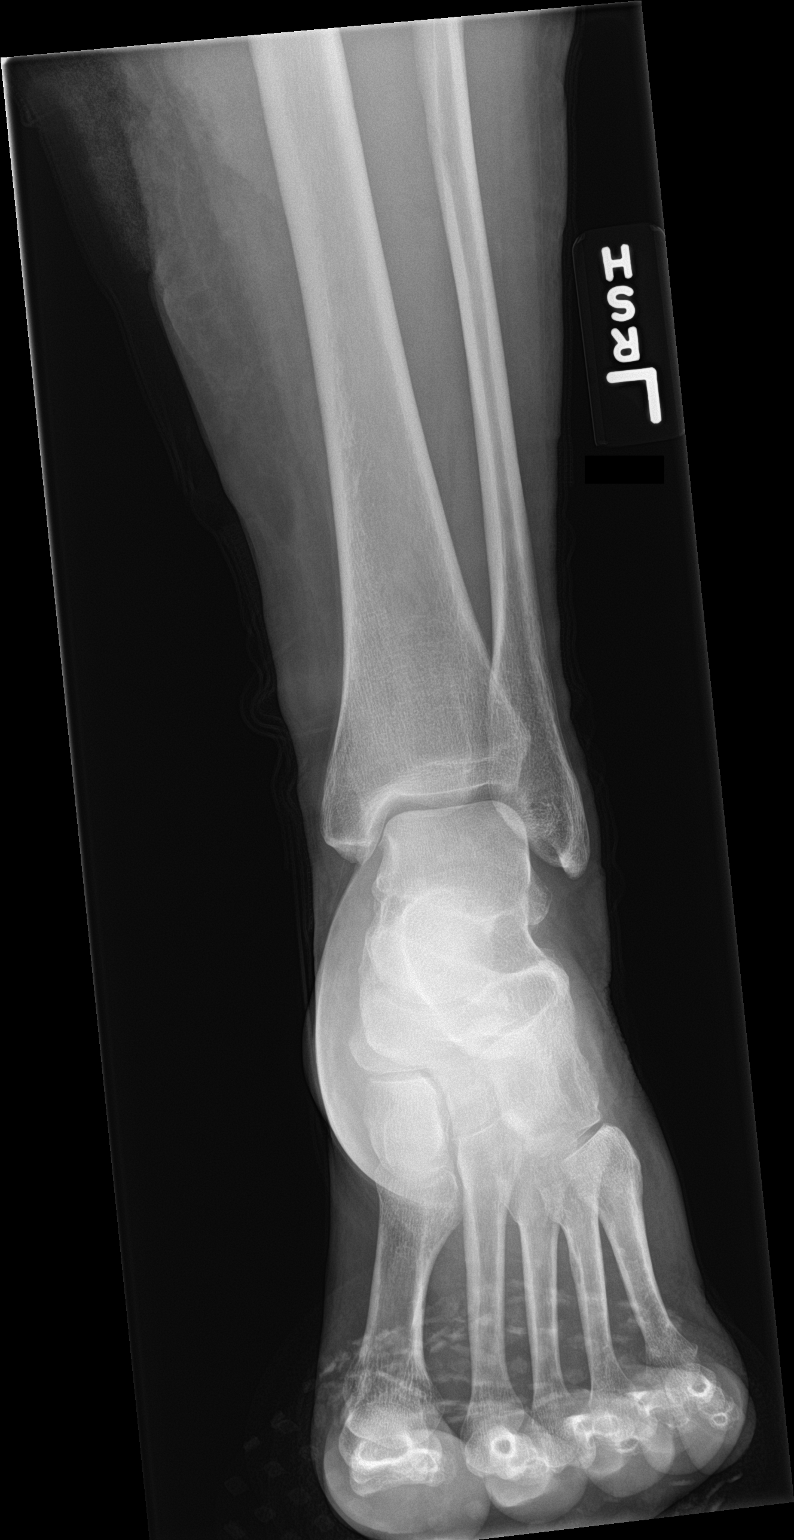
[im 2/3]
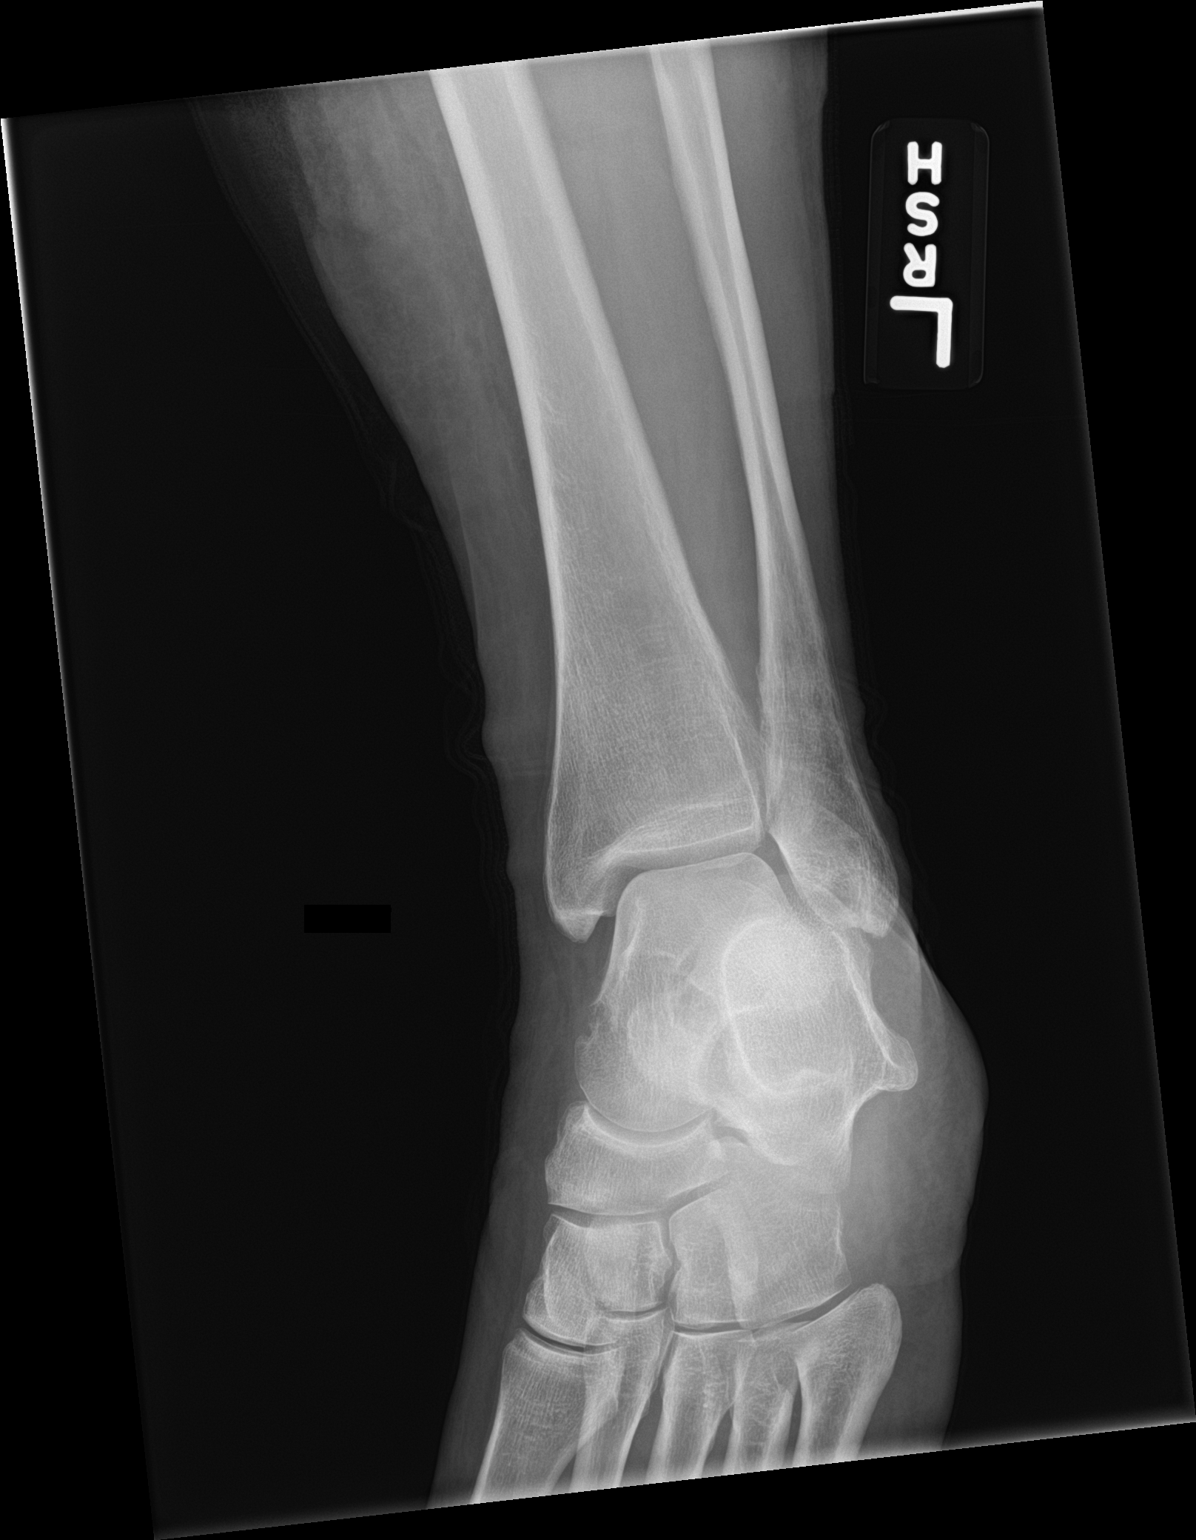
[im 3/3]
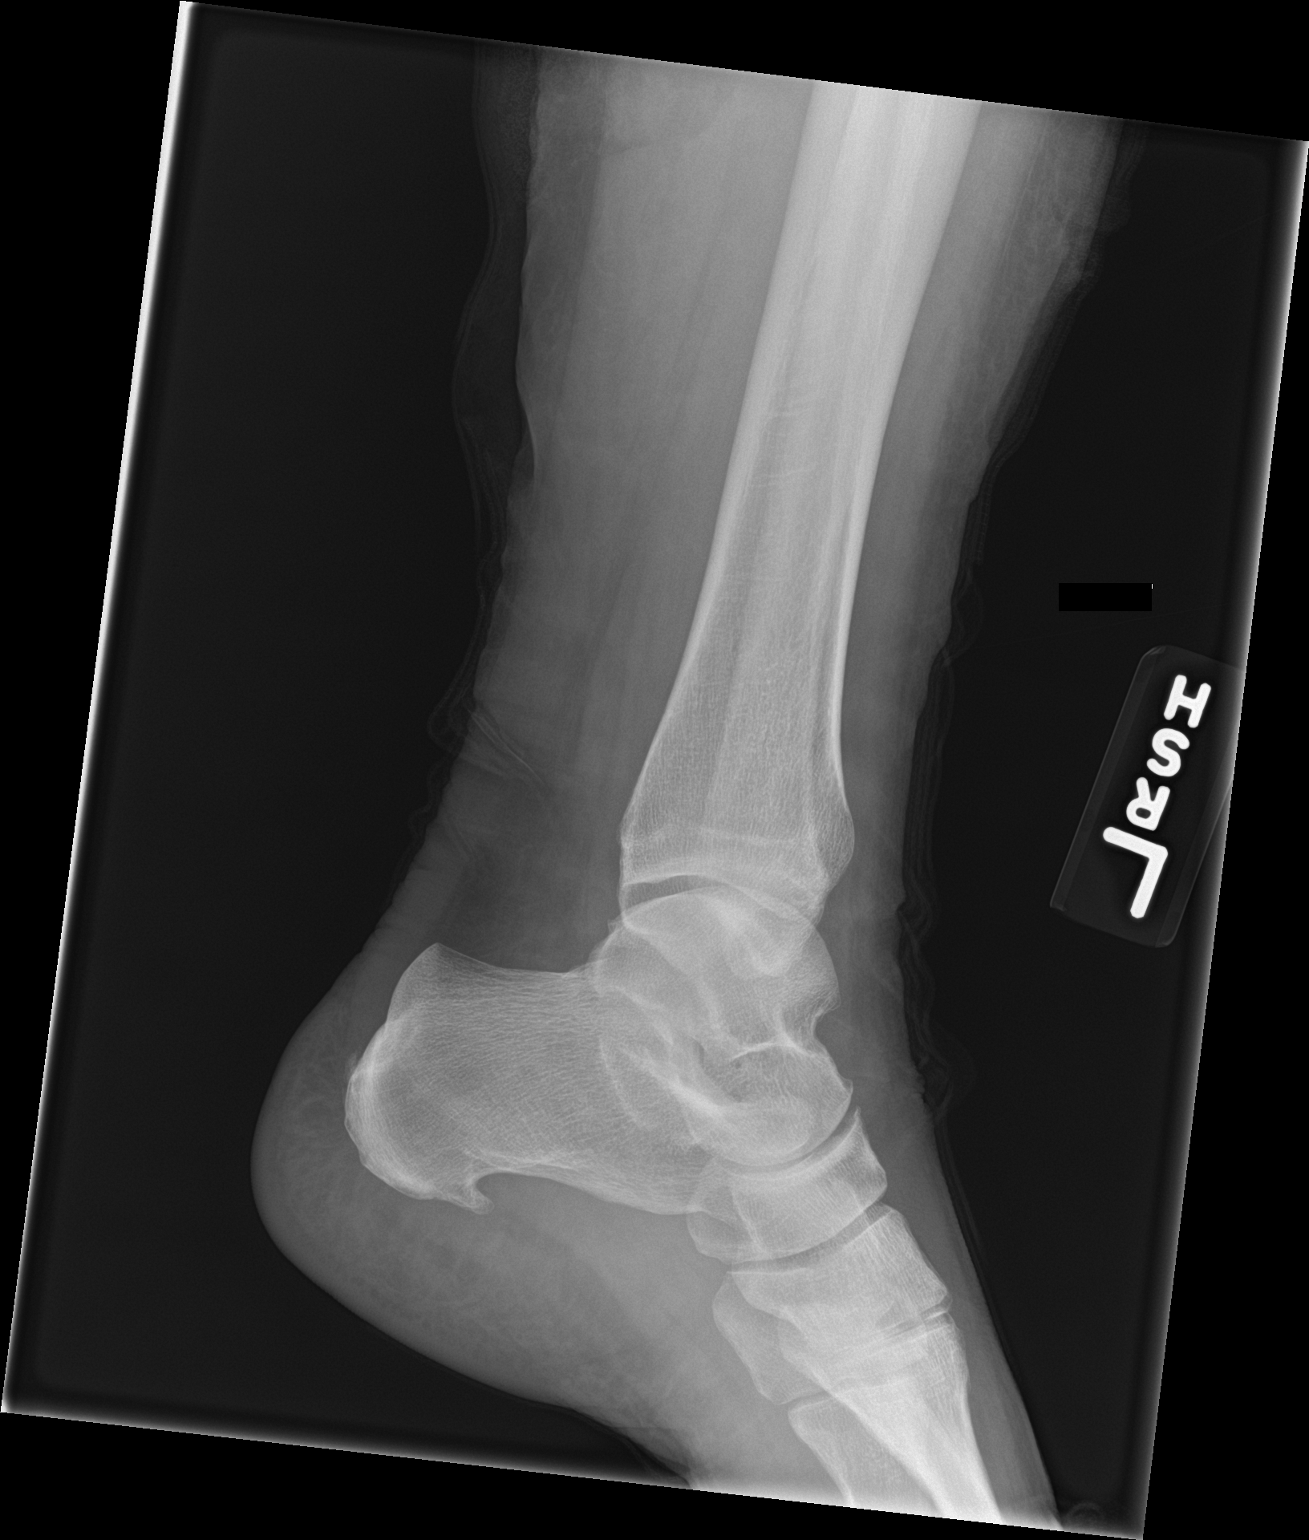

[3 of 3 positions shown; findings below may reference images not displayed]

FINDINGS: Portable views. Partially visible large soft tissue injury along the
medial calf as demonstrated on the comparison. Mortise joint
alignment preserved. Talar dome intact. No ankle joint effusion.
Mild to moderate calcaneus degenerative spurring. No acute osseous
abnormality identified.
IMPRESSION: 1. No acute osseous abnormality about the left ankle.
2. Large soft tissue injury at the level of the mid tib fib as seen
on 11/03/2018.

## 2020-04-10 IMAGING — DX DG KNEE 1-2V PORT*L*
1 series · 2 of 2 positions shown · non-contrast
Comparison: Right tibia and fibula series of today's date

CLINICAL DATA: Lateral left knee pain status post trauma to the mid
chin region after a dog bite.

EXAM:
PORTABLE LEFT KNEE - 1-2 VIEW

[Series 1: knee · 0.14mm/px · 2 of 2 slices shown]
[im 1/2]
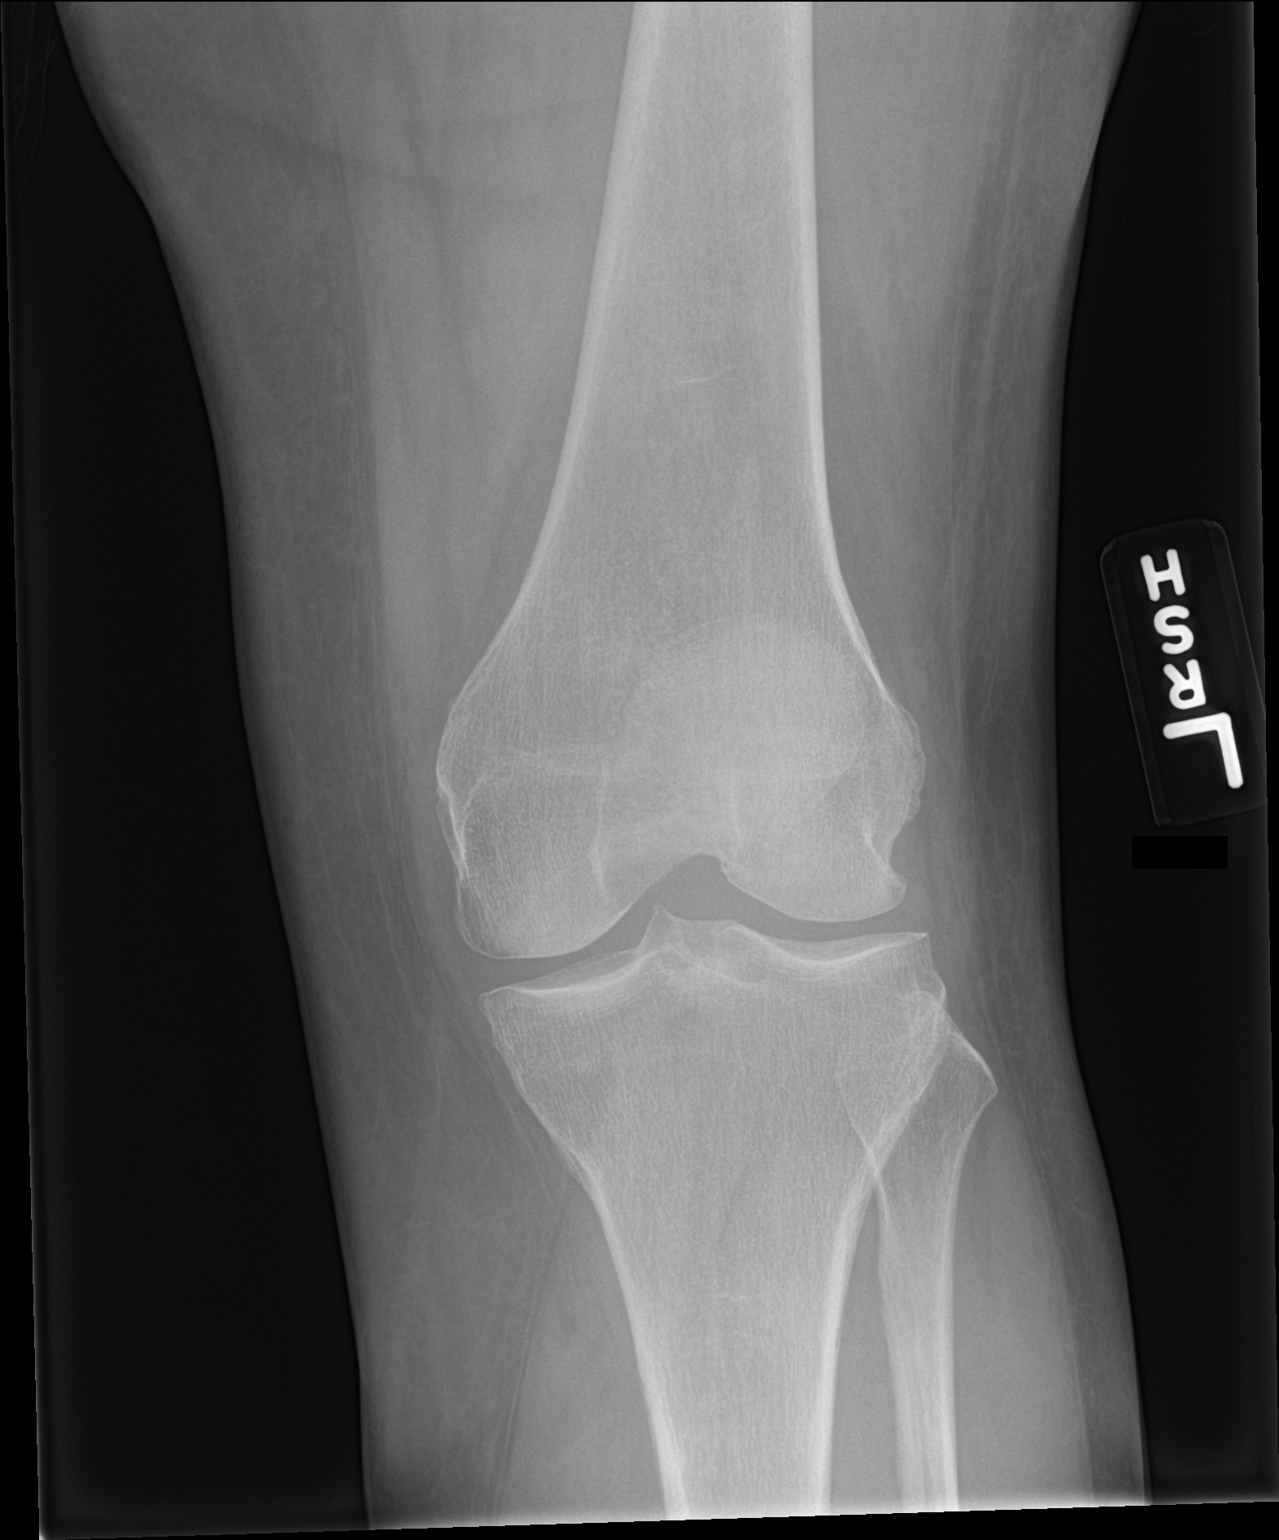
[im 2/2]
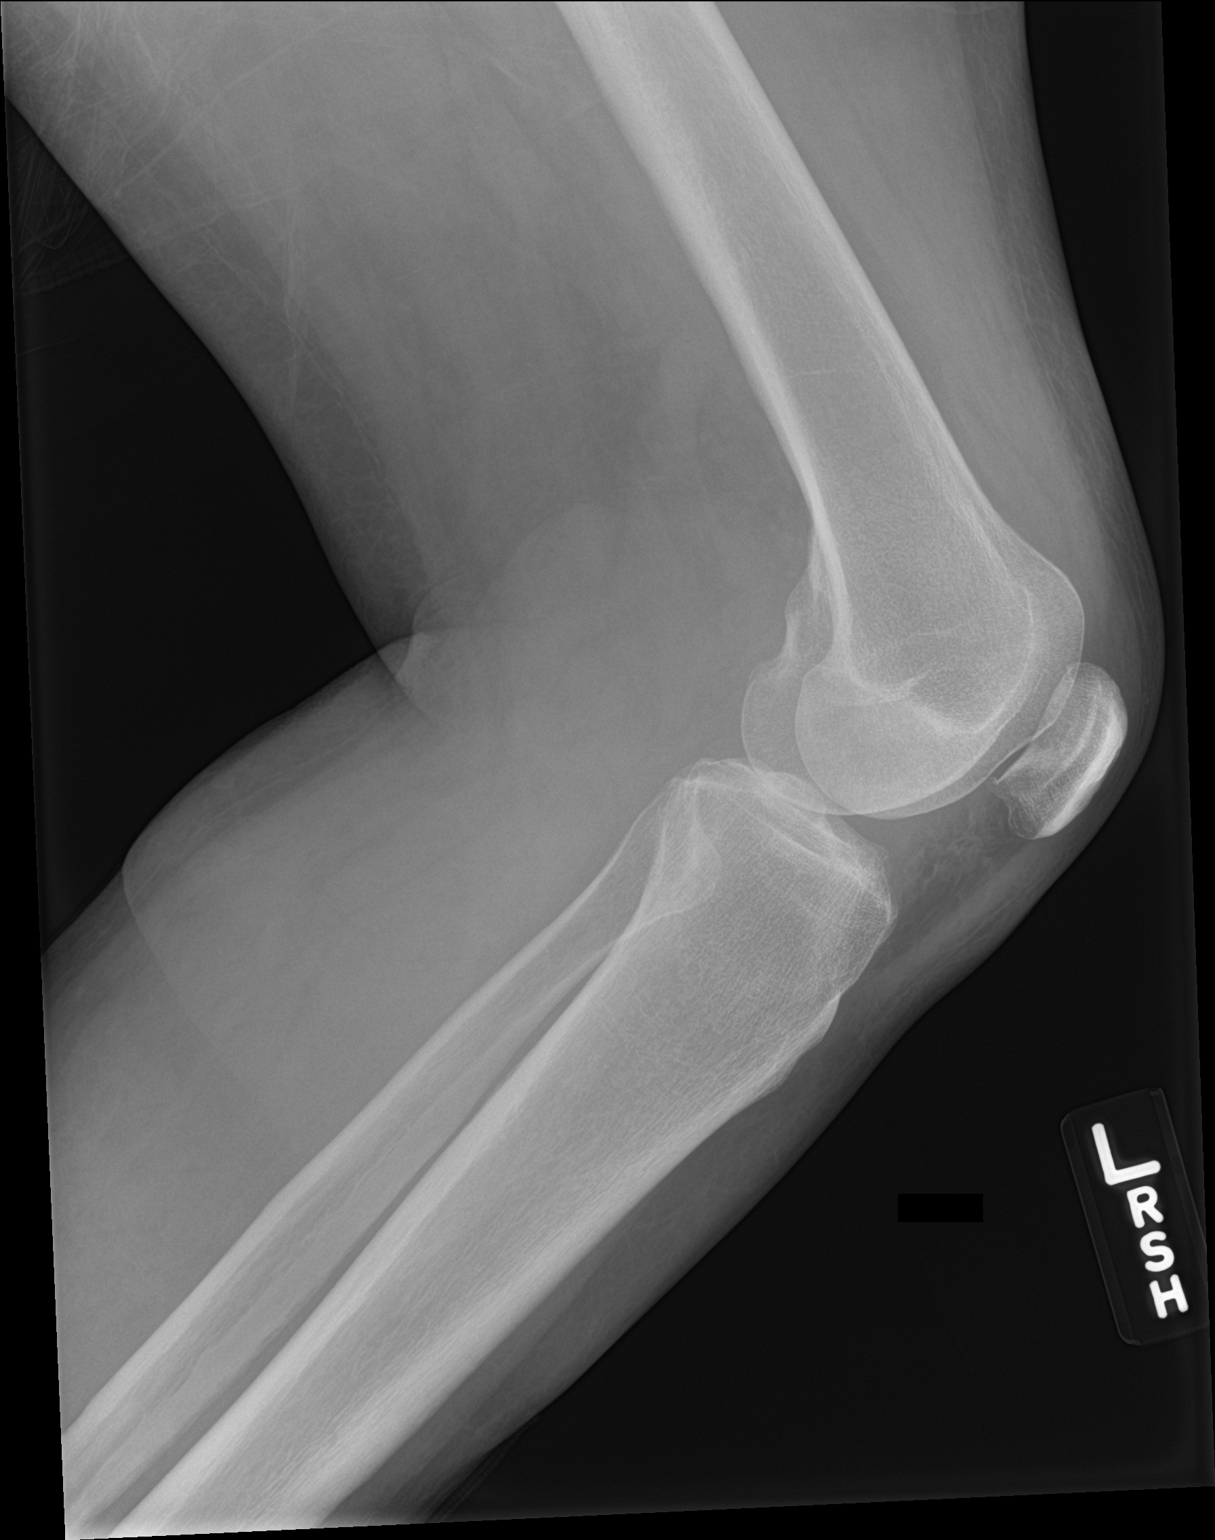

[2 of 2 positions shown; findings below may reference images not displayed]

FINDINGS: The bones about the knee are subjectively adequately mineralized.
There is no acute or healing fracture. There is beaking of the
medial tibial spine. Small spurs arise from the articular margins of
the patella. There is no joint effusion.
IMPRESSION: There is no acute or significant chronic bony abnormality of the
left knee.

## 2020-08-12 ENCOUNTER — Emergency Department (HOSPITAL_COMMUNITY)
Admission: EM | Admit: 2020-08-12 | Discharge: 2020-08-13 | Disposition: A | Discharge status: Home / Self Care | Payer: Self-pay | Attending: Emergency Medicine | Admitting: Emergency Medicine

## 2020-08-12 ENCOUNTER — Other Ambulatory Visit: Payer: Self-pay

## 2020-08-12 ENCOUNTER — Encounter (HOSPITAL_COMMUNITY): Payer: Self-pay | Admitting: Emergency Medicine

## 2020-08-12 DIAGNOSIS — R079 Chest pain, unspecified: Secondary | ICD-10-CM | POA: Insufficient documentation

## 2020-08-12 DIAGNOSIS — Z5321 Procedure and treatment not carried out due to patient leaving prior to being seen by health care provider: Secondary | ICD-10-CM | POA: Insufficient documentation

## 2020-08-12 LAB — CBC
HCT: 45.4 % (ref 36.0–46.0)
Hemoglobin: 15 g/dL (ref 12.0–15.0)
MCH: 34.3 pg — ABNORMAL HIGH (ref 26.0–34.0)
MCHC: 33 g/dL (ref 30.0–36.0)
MCV: 103.9 fL — ABNORMAL HIGH (ref 80.0–100.0)
Platelets: 231 10*3/uL (ref 150–400)
RBC: 4.37 MIL/uL (ref 3.87–5.11)
RDW: 12.5 % (ref 11.5–15.5)
WBC: 9.5 10*3/uL (ref 4.0–10.5)
nRBC: 0 % (ref 0.0–0.2)

## 2020-08-12 LAB — BASIC METABOLIC PANEL
Anion gap: 15 (ref 5–15)
BUN: 12 mg/dL (ref 6–20)
CO2: 23 mmol/L (ref 22–32)
Calcium: 6.5 mg/dL — ABNORMAL LOW (ref 8.9–10.3)
Chloride: 102 mmol/L (ref 98–111)
Creatinine, Ser: 0.96 mg/dL (ref 0.44–1.00)
GFR calc Af Amer: >60 mL/min (ref >60)
GFR calc non Af Amer: >60 mL/min (ref >60)
Glucose, Bld: 109 mg/dL — ABNORMAL HIGH (ref 70–99)
Potassium: 4.2 mmol/L (ref 3.5–5.1)
Sodium: 140 mmol/L (ref 135–145)

## 2020-08-12 LAB — TSH: TSH: 49.06 u[IU]/mL — ABNORMAL HIGH (ref 0.350–4.500)

## 2020-08-12 NOTE — ED Notes (Signed)
Pt called x 3 no answer 

## 2020-08-12 NOTE — ED Triage Notes (Signed)
Pt here with c/o low CA , this is an ongoing issue and does not have a primary MD to follow it

## 2020-08-12 NOTE — ED Triage Notes (Signed)
Emergency Medicine Provider Triage Evaluation Note  Ann Randall , a 49 y.o. female  was evaluated in triage.  Pt complains of abnormal electrolyte values.  Patient states that she had her thyroid removed a long time ago and has had issues with her calcium and potassium level.  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  She states that recently her thyroid level is high.  She states "I am uninsured I keep going in and out years and they just treat me and get me out."  She states "when I used to have thyroid they would bring me into the hospital for 2 weeks and probably full of calcium just second of being kicked out because of uninsured."  She does complain of some blurred vision and heart palpitations.  She recently had a dose change on her calcium.  She states "I just know my heart stop."  Review of Systems  Positive: Blurred vision and palpitations Negative: Chest pain  shortness of breath  Physical Exam  There were no vitals taken for this visit. Gen:   Awake, no distress   HEENT:  Atraumatic  Resp:  Normal effort  Cardiac:  Normal rate  Abd:   Nondistended, nontender  MSK:   Moves extremities without difficulty  Neuro:  Speech clear   Medical Decision Making  Medically screening exam initiated at 2:33 PM.  Appropriate orders placed.  Ann Randall was informed that the remainder of the evaluation will be completed by another provider, this initial triage assessment does not replace that evaluation, and the importance of remaining in the ED until their evaluation is complete.  Clinical Impression  Patient here with complaint of palpitations, abnormal calcium levels.  I have ordered an EKG, basic labs and a TSH.   Arthor Captain, PA-C 08/12/20 1444

## 2022-03-16 ENCOUNTER — Telehealth: Payer: Self-pay

## 2022-03-16 NOTE — Telephone Encounter (Signed)
[employer] requested we re-fax a more legible copy of information they had already received on patient.  Faxed successfully to [phone] on 03/16/2022. ?

## 2022-05-17 ENCOUNTER — Telehealth: Payer: Self-pay | Admitting: Plastic Surgery

## 2022-05-17 NOTE — Telephone Encounter (Signed)
Forwarded information to [employer], Print production planner. She was going to return Mr. Ann Randall phone call.

## 2022-05-17 NOTE — Telephone Encounter (Signed)
Received voicemail message from Ann Randall with [employer] on behalf of Ann Randall, Ann Randall to schedule an appointment between him and Dr. Ulice Bold to discuss patient's treatment. Please advise at [phone].

## 2022-05-23 NOTE — Telephone Encounter (Signed)
Ann Randall called back to follow up. Advised her Dr. Ulice Bold is on vacation and doesn't come back until after the holiday. She will advise the attorney.

## 2022-06-08 NOTE — Telephone Encounter (Signed)
Received a follow up call from [facility]; still waiting to set up appointment with Dr. Ulice Bold to discuss patient's treatment. Requesting call back asap. Please advise at [phone] and ask for Marchelle Folks or Marisue Ivan.

## 2022-07-04 ENCOUNTER — Telehealth: Payer: Self-pay | Admitting: Plastic Surgery

## 2022-07-04 NOTE — Telephone Encounter (Signed)
error 

## 2022-07-04 NOTE — Telephone Encounter (Signed)
Returned call to number in notes - attorney office lvm for someone to call me back.
